# Patient Record
Sex: Male | Born: 1960
Health system: Southern US, Community
[De-identification: ages and names within clinical notes are randomized; demographics above are authoritative.]

## PROBLEM LIST (undated history)

## (undated) DIAGNOSIS — G4730 Sleep apnea, unspecified: Secondary | ICD-10-CM

## (undated) DIAGNOSIS — J449 Chronic obstructive pulmonary disease, unspecified: Secondary | ICD-10-CM

## (undated) DIAGNOSIS — F329 Major depressive disorder, single episode, unspecified: Secondary | ICD-10-CM

## (undated) DIAGNOSIS — F32A Depression, unspecified: Secondary | ICD-10-CM

## (undated) DIAGNOSIS — J189 Pneumonia, unspecified organism: Secondary | ICD-10-CM

## (undated) DIAGNOSIS — Z9981 Dependence on supplemental oxygen: Secondary | ICD-10-CM

## (undated) DIAGNOSIS — E119 Type 2 diabetes mellitus without complications: Secondary | ICD-10-CM

## (undated) DIAGNOSIS — I209 Angina pectoris, unspecified: Secondary | ICD-10-CM

## (undated) DIAGNOSIS — I10 Essential (primary) hypertension: Secondary | ICD-10-CM

## (undated) DIAGNOSIS — I219 Acute myocardial infarction, unspecified: Secondary | ICD-10-CM

## (undated) DIAGNOSIS — K219 Gastro-esophageal reflux disease without esophagitis: Secondary | ICD-10-CM

## (undated) DIAGNOSIS — M1990 Unspecified osteoarthritis, unspecified site: Secondary | ICD-10-CM

## (undated) DIAGNOSIS — I509 Heart failure, unspecified: Secondary | ICD-10-CM

## (undated) DIAGNOSIS — F419 Anxiety disorder, unspecified: Secondary | ICD-10-CM

## (undated) DIAGNOSIS — K5792 Diverticulitis of intestine, part unspecified, without perforation or abscess without bleeding: Secondary | ICD-10-CM

## (undated) DIAGNOSIS — S46002A Unspecified injury of muscle(s) and tendon(s) of the rotator cuff of left shoulder, initial encounter: Secondary | ICD-10-CM

## (undated) HISTORY — PX: ROTATOR CUFF REPAIR: SHX139

## (undated) HISTORY — DX: Diverticulitis of intestine, part unspecified, without perforation or abscess without bleeding: K57.92

## (undated) HISTORY — PX: CORONARY ARTERY BYPASS GRAFT: SHX141

## (undated) HISTORY — PX: CORONARY ANGIOPLASTY: SHX604

## (undated) HISTORY — DX: Chronic obstructive pulmonary disease, unspecified: J44.9

---

## 2005-08-23 ENCOUNTER — Inpatient Hospital Stay (HOSPITAL_COMMUNITY): Admission: EM | Admit: 2005-08-23 | Discharge: 2005-08-25 | Payer: Self-pay | Admitting: Cardiovascular Disease

## 2006-02-17 ENCOUNTER — Inpatient Hospital Stay (HOSPITAL_COMMUNITY): Admission: AD | Admit: 2006-02-17 | Discharge: 2006-02-27 | Payer: Self-pay | Admitting: Cardiovascular Disease

## 2011-03-19 DIAGNOSIS — Z6833 Body mass index (BMI) 33.0-33.9, adult: Secondary | ICD-10-CM | POA: Diagnosis not present

## 2011-03-19 DIAGNOSIS — S2220XA Unspecified fracture of sternum, initial encounter for closed fracture: Secondary | ICD-10-CM | POA: Diagnosis not present

## 2011-03-19 DIAGNOSIS — E291 Testicular hypofunction: Secondary | ICD-10-CM | POA: Diagnosis not present

## 2011-03-19 DIAGNOSIS — M5030 Other cervical disc degeneration, unspecified cervical region: Secondary | ICD-10-CM | POA: Diagnosis not present

## 2011-03-20 DIAGNOSIS — K4090 Unilateral inguinal hernia, without obstruction or gangrene, not specified as recurrent: Secondary | ICD-10-CM | POA: Diagnosis not present

## 2011-03-20 DIAGNOSIS — R109 Unspecified abdominal pain: Secondary | ICD-10-CM | POA: Diagnosis not present

## 2011-03-20 DIAGNOSIS — I10 Essential (primary) hypertension: Secondary | ICD-10-CM | POA: Diagnosis not present

## 2011-03-20 DIAGNOSIS — E119 Type 2 diabetes mellitus without complications: Secondary | ICD-10-CM | POA: Diagnosis not present

## 2011-04-02 DIAGNOSIS — E291 Testicular hypofunction: Secondary | ICD-10-CM | POA: Diagnosis not present

## 2011-04-05 DIAGNOSIS — R109 Unspecified abdominal pain: Secondary | ICD-10-CM | POA: Diagnosis not present

## 2011-04-05 DIAGNOSIS — I10 Essential (primary) hypertension: Secondary | ICD-10-CM | POA: Diagnosis not present

## 2011-04-05 DIAGNOSIS — N419 Inflammatory disease of prostate, unspecified: Secondary | ICD-10-CM | POA: Diagnosis not present

## 2011-04-05 DIAGNOSIS — Z951 Presence of aortocoronary bypass graft: Secondary | ICD-10-CM | POA: Diagnosis not present

## 2011-04-05 DIAGNOSIS — I2510 Atherosclerotic heart disease of native coronary artery without angina pectoris: Secondary | ICD-10-CM | POA: Diagnosis not present

## 2011-04-05 DIAGNOSIS — R5383 Other fatigue: Secondary | ICD-10-CM | POA: Diagnosis not present

## 2011-04-05 DIAGNOSIS — R5381 Other malaise: Secondary | ICD-10-CM | POA: Diagnosis not present

## 2011-04-05 DIAGNOSIS — E119 Type 2 diabetes mellitus without complications: Secondary | ICD-10-CM | POA: Diagnosis not present

## 2011-04-05 DIAGNOSIS — N410 Acute prostatitis: Secondary | ICD-10-CM | POA: Diagnosis not present

## 2011-04-16 DIAGNOSIS — Z79899 Other long term (current) drug therapy: Secondary | ICD-10-CM | POA: Diagnosis not present

## 2011-04-16 DIAGNOSIS — E291 Testicular hypofunction: Secondary | ICD-10-CM | POA: Diagnosis not present

## 2011-04-16 DIAGNOSIS — G894 Chronic pain syndrome: Secondary | ICD-10-CM | POA: Diagnosis not present

## 2011-04-16 DIAGNOSIS — Z6833 Body mass index (BMI) 33.0-33.9, adult: Secondary | ICD-10-CM | POA: Diagnosis not present

## 2011-04-16 DIAGNOSIS — I10 Essential (primary) hypertension: Secondary | ICD-10-CM | POA: Diagnosis not present

## 2011-04-30 DIAGNOSIS — E291 Testicular hypofunction: Secondary | ICD-10-CM | POA: Diagnosis not present

## 2011-05-14 DIAGNOSIS — G894 Chronic pain syndrome: Secondary | ICD-10-CM | POA: Diagnosis not present

## 2011-05-14 DIAGNOSIS — E291 Testicular hypofunction: Secondary | ICD-10-CM | POA: Diagnosis not present

## 2011-05-27 DIAGNOSIS — E291 Testicular hypofunction: Secondary | ICD-10-CM | POA: Diagnosis not present

## 2011-06-10 DIAGNOSIS — J0100 Acute maxillary sinusitis, unspecified: Secondary | ICD-10-CM | POA: Diagnosis not present

## 2011-06-10 DIAGNOSIS — Z6834 Body mass index (BMI) 34.0-34.9, adult: Secondary | ICD-10-CM | POA: Diagnosis not present

## 2011-06-10 DIAGNOSIS — S2220XA Unspecified fracture of sternum, initial encounter for closed fracture: Secondary | ICD-10-CM | POA: Diagnosis not present

## 2011-06-10 DIAGNOSIS — E291 Testicular hypofunction: Secondary | ICD-10-CM | POA: Diagnosis not present

## 2011-06-10 DIAGNOSIS — G894 Chronic pain syndrome: Secondary | ICD-10-CM | POA: Diagnosis not present

## 2011-06-24 DIAGNOSIS — E291 Testicular hypofunction: Secondary | ICD-10-CM | POA: Diagnosis not present

## 2011-07-09 DIAGNOSIS — E785 Hyperlipidemia, unspecified: Secondary | ICD-10-CM | POA: Diagnosis not present

## 2011-07-09 DIAGNOSIS — G894 Chronic pain syndrome: Secondary | ICD-10-CM | POA: Diagnosis not present

## 2011-07-09 DIAGNOSIS — Z79899 Other long term (current) drug therapy: Secondary | ICD-10-CM | POA: Diagnosis not present

## 2011-07-09 DIAGNOSIS — E291 Testicular hypofunction: Secondary | ICD-10-CM | POA: Diagnosis not present

## 2011-07-09 DIAGNOSIS — Z6834 Body mass index (BMI) 34.0-34.9, adult: Secondary | ICD-10-CM | POA: Diagnosis not present

## 2011-07-09 DIAGNOSIS — E119 Type 2 diabetes mellitus without complications: Secondary | ICD-10-CM | POA: Diagnosis not present

## 2011-07-23 DIAGNOSIS — E291 Testicular hypofunction: Secondary | ICD-10-CM | POA: Diagnosis not present

## 2011-08-06 DIAGNOSIS — G894 Chronic pain syndrome: Secondary | ICD-10-CM | POA: Diagnosis not present

## 2011-08-06 DIAGNOSIS — Z6834 Body mass index (BMI) 34.0-34.9, adult: Secondary | ICD-10-CM | POA: Diagnosis not present

## 2011-08-06 DIAGNOSIS — S2220XA Unspecified fracture of sternum, initial encounter for closed fracture: Secondary | ICD-10-CM | POA: Diagnosis not present

## 2011-08-06 DIAGNOSIS — E291 Testicular hypofunction: Secondary | ICD-10-CM | POA: Diagnosis not present

## 2011-08-06 DIAGNOSIS — M5030 Other cervical disc degeneration, unspecified cervical region: Secondary | ICD-10-CM | POA: Diagnosis not present

## 2011-08-13 DIAGNOSIS — E291 Testicular hypofunction: Secondary | ICD-10-CM | POA: Diagnosis not present

## 2011-08-20 DIAGNOSIS — E291 Testicular hypofunction: Secondary | ICD-10-CM | POA: Diagnosis not present

## 2011-08-27 DIAGNOSIS — E291 Testicular hypofunction: Secondary | ICD-10-CM | POA: Diagnosis not present

## 2011-09-03 DIAGNOSIS — E291 Testicular hypofunction: Secondary | ICD-10-CM | POA: Diagnosis not present

## 2011-09-03 DIAGNOSIS — M5030 Other cervical disc degeneration, unspecified cervical region: Secondary | ICD-10-CM | POA: Diagnosis not present

## 2011-09-03 DIAGNOSIS — Z6834 Body mass index (BMI) 34.0-34.9, adult: Secondary | ICD-10-CM | POA: Diagnosis not present

## 2011-09-03 DIAGNOSIS — G894 Chronic pain syndrome: Secondary | ICD-10-CM | POA: Diagnosis not present

## 2011-09-03 DIAGNOSIS — S2220XA Unspecified fracture of sternum, initial encounter for closed fracture: Secondary | ICD-10-CM | POA: Diagnosis not present

## 2011-09-10 DIAGNOSIS — E291 Testicular hypofunction: Secondary | ICD-10-CM | POA: Diagnosis not present

## 2011-09-13 DIAGNOSIS — R5381 Other malaise: Secondary | ICD-10-CM | POA: Diagnosis not present

## 2011-09-13 DIAGNOSIS — I10 Essential (primary) hypertension: Secondary | ICD-10-CM | POA: Diagnosis not present

## 2011-09-13 DIAGNOSIS — R6889 Other general symptoms and signs: Secondary | ICD-10-CM | POA: Diagnosis not present

## 2011-09-13 DIAGNOSIS — E876 Hypokalemia: Secondary | ICD-10-CM | POA: Diagnosis not present

## 2011-09-13 DIAGNOSIS — I252 Old myocardial infarction: Secondary | ICD-10-CM | POA: Diagnosis not present

## 2011-09-13 DIAGNOSIS — R5383 Other fatigue: Secondary | ICD-10-CM | POA: Diagnosis not present

## 2011-09-13 DIAGNOSIS — R0789 Other chest pain: Secondary | ICD-10-CM | POA: Diagnosis not present

## 2011-09-13 DIAGNOSIS — E7800 Pure hypercholesterolemia, unspecified: Secondary | ICD-10-CM | POA: Diagnosis not present

## 2011-09-13 DIAGNOSIS — R252 Cramp and spasm: Secondary | ICD-10-CM | POA: Diagnosis not present

## 2011-09-13 DIAGNOSIS — E119 Type 2 diabetes mellitus without complications: Secondary | ICD-10-CM | POA: Diagnosis not present

## 2011-09-13 DIAGNOSIS — R072 Precordial pain: Secondary | ICD-10-CM | POA: Diagnosis not present

## 2011-09-13 DIAGNOSIS — IMO0001 Reserved for inherently not codable concepts without codable children: Secondary | ICD-10-CM | POA: Diagnosis not present

## 2011-09-13 DIAGNOSIS — I509 Heart failure, unspecified: Secondary | ICD-10-CM | POA: Diagnosis not present

## 2011-09-17 DIAGNOSIS — E291 Testicular hypofunction: Secondary | ICD-10-CM | POA: Diagnosis not present

## 2011-09-22 DIAGNOSIS — E876 Hypokalemia: Secondary | ICD-10-CM | POA: Diagnosis not present

## 2011-09-24 DIAGNOSIS — E291 Testicular hypofunction: Secondary | ICD-10-CM | POA: Diagnosis not present

## 2011-09-26 DIAGNOSIS — Z6834 Body mass index (BMI) 34.0-34.9, adult: Secondary | ICD-10-CM | POA: Diagnosis not present

## 2011-09-26 DIAGNOSIS — K589 Irritable bowel syndrome without diarrhea: Secondary | ICD-10-CM | POA: Diagnosis not present

## 2011-09-26 DIAGNOSIS — K2900 Acute gastritis without bleeding: Secondary | ICD-10-CM | POA: Diagnosis not present

## 2011-10-03 DIAGNOSIS — E291 Testicular hypofunction: Secondary | ICD-10-CM | POA: Diagnosis not present

## 2011-10-10 DIAGNOSIS — E291 Testicular hypofunction: Secondary | ICD-10-CM | POA: Diagnosis not present

## 2011-10-17 DIAGNOSIS — E291 Testicular hypofunction: Secondary | ICD-10-CM | POA: Diagnosis not present

## 2011-10-24 DIAGNOSIS — S2220XA Unspecified fracture of sternum, initial encounter for closed fracture: Secondary | ICD-10-CM | POA: Diagnosis not present

## 2011-10-24 DIAGNOSIS — Z6835 Body mass index (BMI) 35.0-35.9, adult: Secondary | ICD-10-CM | POA: Diagnosis not present

## 2011-10-24 DIAGNOSIS — E291 Testicular hypofunction: Secondary | ICD-10-CM | POA: Diagnosis not present

## 2011-10-24 DIAGNOSIS — G894 Chronic pain syndrome: Secondary | ICD-10-CM | POA: Diagnosis not present

## 2011-10-24 DIAGNOSIS — M5030 Other cervical disc degeneration, unspecified cervical region: Secondary | ICD-10-CM | POA: Diagnosis not present

## 2011-10-24 DIAGNOSIS — E876 Hypokalemia: Secondary | ICD-10-CM | POA: Diagnosis not present

## 2011-10-28 DIAGNOSIS — E119 Type 2 diabetes mellitus without complications: Secondary | ICD-10-CM | POA: Diagnosis not present

## 2011-10-28 DIAGNOSIS — I428 Other cardiomyopathies: Secondary | ICD-10-CM | POA: Diagnosis not present

## 2011-10-28 DIAGNOSIS — Z9119 Patient's noncompliance with other medical treatment and regimen: Secondary | ICD-10-CM | POA: Diagnosis not present

## 2011-10-28 DIAGNOSIS — I2510 Atherosclerotic heart disease of native coronary artery without angina pectoris: Secondary | ICD-10-CM | POA: Diagnosis not present

## 2011-10-28 DIAGNOSIS — R079 Chest pain, unspecified: Secondary | ICD-10-CM | POA: Diagnosis not present

## 2011-10-28 DIAGNOSIS — R1012 Left upper quadrant pain: Secondary | ICD-10-CM | POA: Diagnosis not present

## 2011-10-28 DIAGNOSIS — G8929 Other chronic pain: Secondary | ICD-10-CM | POA: Diagnosis not present

## 2011-10-28 DIAGNOSIS — R0602 Shortness of breath: Secondary | ICD-10-CM | POA: Diagnosis not present

## 2011-10-28 DIAGNOSIS — R0789 Other chest pain: Secondary | ICD-10-CM | POA: Diagnosis not present

## 2011-10-28 DIAGNOSIS — R072 Precordial pain: Secondary | ICD-10-CM | POA: Diagnosis not present

## 2011-10-28 DIAGNOSIS — Z951 Presence of aortocoronary bypass graft: Secondary | ICD-10-CM | POA: Diagnosis not present

## 2011-10-31 DIAGNOSIS — E291 Testicular hypofunction: Secondary | ICD-10-CM | POA: Diagnosis not present

## 2011-11-04 DIAGNOSIS — R1013 Epigastric pain: Secondary | ICD-10-CM | POA: Diagnosis not present

## 2011-11-04 DIAGNOSIS — R1012 Left upper quadrant pain: Secondary | ICD-10-CM | POA: Diagnosis not present

## 2011-11-10 DIAGNOSIS — E785 Hyperlipidemia, unspecified: Secondary | ICD-10-CM | POA: Diagnosis not present

## 2011-11-10 DIAGNOSIS — E119 Type 2 diabetes mellitus without complications: Secondary | ICD-10-CM | POA: Diagnosis not present

## 2011-11-10 DIAGNOSIS — I10 Essential (primary) hypertension: Secondary | ICD-10-CM | POA: Diagnosis not present

## 2011-11-10 DIAGNOSIS — D485 Neoplasm of uncertain behavior of skin: Secondary | ICD-10-CM | POA: Diagnosis not present

## 2011-11-10 DIAGNOSIS — L708 Other acne: Secondary | ICD-10-CM | POA: Diagnosis not present

## 2011-11-10 DIAGNOSIS — I2510 Atherosclerotic heart disease of native coronary artery without angina pectoris: Secondary | ICD-10-CM | POA: Diagnosis not present

## 2011-11-14 DIAGNOSIS — E291 Testicular hypofunction: Secondary | ICD-10-CM | POA: Diagnosis not present

## 2011-11-21 DIAGNOSIS — I10 Essential (primary) hypertension: Secondary | ICD-10-CM | POA: Diagnosis not present

## 2011-11-21 DIAGNOSIS — G894 Chronic pain syndrome: Secondary | ICD-10-CM | POA: Diagnosis not present

## 2011-11-21 DIAGNOSIS — Z6835 Body mass index (BMI) 35.0-35.9, adult: Secondary | ICD-10-CM | POA: Diagnosis not present

## 2011-11-21 DIAGNOSIS — E291 Testicular hypofunction: Secondary | ICD-10-CM | POA: Diagnosis not present

## 2011-11-28 DIAGNOSIS — E291 Testicular hypofunction: Secondary | ICD-10-CM | POA: Diagnosis not present

## 2011-12-05 DIAGNOSIS — E291 Testicular hypofunction: Secondary | ICD-10-CM | POA: Diagnosis not present

## 2011-12-19 DIAGNOSIS — S2220XA Unspecified fracture of sternum, initial encounter for closed fracture: Secondary | ICD-10-CM | POA: Diagnosis not present

## 2011-12-19 DIAGNOSIS — M5137 Other intervertebral disc degeneration, lumbosacral region: Secondary | ICD-10-CM | POA: Diagnosis not present

## 2011-12-19 DIAGNOSIS — G8911 Acute pain due to trauma: Secondary | ICD-10-CM | POA: Diagnosis not present

## 2011-12-19 DIAGNOSIS — G894 Chronic pain syndrome: Secondary | ICD-10-CM | POA: Diagnosis not present

## 2011-12-19 DIAGNOSIS — M5030 Other cervical disc degeneration, unspecified cervical region: Secondary | ICD-10-CM | POA: Diagnosis not present

## 2011-12-19 DIAGNOSIS — E291 Testicular hypofunction: Secondary | ICD-10-CM | POA: Diagnosis not present

## 2011-12-26 DIAGNOSIS — E291 Testicular hypofunction: Secondary | ICD-10-CM | POA: Diagnosis not present

## 2012-01-09 DIAGNOSIS — E291 Testicular hypofunction: Secondary | ICD-10-CM | POA: Diagnosis not present

## 2012-01-09 DIAGNOSIS — Z79899 Other long term (current) drug therapy: Secondary | ICD-10-CM | POA: Diagnosis not present

## 2012-01-16 DIAGNOSIS — S2220XA Unspecified fracture of sternum, initial encounter for closed fracture: Secondary | ICD-10-CM | POA: Diagnosis not present

## 2012-01-16 DIAGNOSIS — Z6834 Body mass index (BMI) 34.0-34.9, adult: Secondary | ICD-10-CM | POA: Diagnosis not present

## 2012-01-16 DIAGNOSIS — E291 Testicular hypofunction: Secondary | ICD-10-CM | POA: Diagnosis not present

## 2012-01-16 DIAGNOSIS — G894 Chronic pain syndrome: Secondary | ICD-10-CM | POA: Diagnosis not present

## 2012-01-16 DIAGNOSIS — M5030 Other cervical disc degeneration, unspecified cervical region: Secondary | ICD-10-CM | POA: Diagnosis not present

## 2012-01-23 DIAGNOSIS — E291 Testicular hypofunction: Secondary | ICD-10-CM | POA: Diagnosis not present

## 2012-01-30 DIAGNOSIS — E291 Testicular hypofunction: Secondary | ICD-10-CM | POA: Diagnosis not present

## 2012-02-13 DIAGNOSIS — S2220XA Unspecified fracture of sternum, initial encounter for closed fracture: Secondary | ICD-10-CM | POA: Diagnosis not present

## 2012-02-13 DIAGNOSIS — Z6834 Body mass index (BMI) 34.0-34.9, adult: Secondary | ICD-10-CM | POA: Diagnosis not present

## 2012-02-13 DIAGNOSIS — G894 Chronic pain syndrome: Secondary | ICD-10-CM | POA: Diagnosis not present

## 2012-02-13 DIAGNOSIS — M5030 Other cervical disc degeneration, unspecified cervical region: Secondary | ICD-10-CM | POA: Diagnosis not present

## 2012-02-13 DIAGNOSIS — E291 Testicular hypofunction: Secondary | ICD-10-CM | POA: Diagnosis not present

## 2012-02-20 DIAGNOSIS — E291 Testicular hypofunction: Secondary | ICD-10-CM | POA: Diagnosis not present

## 2012-02-27 DIAGNOSIS — E291 Testicular hypofunction: Secondary | ICD-10-CM | POA: Diagnosis not present

## 2012-03-05 DIAGNOSIS — E291 Testicular hypofunction: Secondary | ICD-10-CM | POA: Diagnosis not present

## 2012-03-07 DIAGNOSIS — I2510 Atherosclerotic heart disease of native coronary artery without angina pectoris: Secondary | ICD-10-CM | POA: Diagnosis not present

## 2012-03-07 DIAGNOSIS — R0789 Other chest pain: Secondary | ICD-10-CM | POA: Diagnosis not present

## 2012-03-07 DIAGNOSIS — I509 Heart failure, unspecified: Secondary | ICD-10-CM | POA: Diagnosis not present

## 2012-03-07 DIAGNOSIS — R079 Chest pain, unspecified: Secondary | ICD-10-CM | POA: Diagnosis not present

## 2012-03-07 DIAGNOSIS — R0609 Other forms of dyspnea: Secondary | ICD-10-CM | POA: Diagnosis not present

## 2012-03-07 DIAGNOSIS — E119 Type 2 diabetes mellitus without complications: Secondary | ICD-10-CM | POA: Diagnosis not present

## 2012-03-07 DIAGNOSIS — I252 Old myocardial infarction: Secondary | ICD-10-CM | POA: Diagnosis not present

## 2012-03-07 DIAGNOSIS — R0602 Shortness of breath: Secondary | ICD-10-CM | POA: Diagnosis not present

## 2012-03-07 DIAGNOSIS — K219 Gastro-esophageal reflux disease without esophagitis: Secondary | ICD-10-CM | POA: Diagnosis not present

## 2012-03-12 DIAGNOSIS — E291 Testicular hypofunction: Secondary | ICD-10-CM | POA: Diagnosis not present

## 2012-03-12 DIAGNOSIS — M5030 Other cervical disc degeneration, unspecified cervical region: Secondary | ICD-10-CM | POA: Diagnosis not present

## 2012-03-12 DIAGNOSIS — Z6834 Body mass index (BMI) 34.0-34.9, adult: Secondary | ICD-10-CM | POA: Diagnosis not present

## 2012-03-12 DIAGNOSIS — I10 Essential (primary) hypertension: Secondary | ICD-10-CM | POA: Diagnosis not present

## 2012-03-12 DIAGNOSIS — G894 Chronic pain syndrome: Secondary | ICD-10-CM | POA: Diagnosis not present

## 2012-03-12 DIAGNOSIS — S2220XA Unspecified fracture of sternum, initial encounter for closed fracture: Secondary | ICD-10-CM | POA: Diagnosis not present

## 2012-03-12 DIAGNOSIS — IMO0001 Reserved for inherently not codable concepts without codable children: Secondary | ICD-10-CM | POA: Diagnosis not present

## 2012-03-12 DIAGNOSIS — E785 Hyperlipidemia, unspecified: Secondary | ICD-10-CM | POA: Diagnosis not present

## 2012-03-19 DIAGNOSIS — E291 Testicular hypofunction: Secondary | ICD-10-CM | POA: Diagnosis not present

## 2012-03-26 DIAGNOSIS — E291 Testicular hypofunction: Secondary | ICD-10-CM | POA: Diagnosis not present

## 2012-04-02 DIAGNOSIS — E291 Testicular hypofunction: Secondary | ICD-10-CM | POA: Diagnosis not present

## 2012-04-09 DIAGNOSIS — M5030 Other cervical disc degeneration, unspecified cervical region: Secondary | ICD-10-CM | POA: Diagnosis not present

## 2012-04-09 DIAGNOSIS — E291 Testicular hypofunction: Secondary | ICD-10-CM | POA: Diagnosis not present

## 2012-04-09 DIAGNOSIS — Z6835 Body mass index (BMI) 35.0-35.9, adult: Secondary | ICD-10-CM | POA: Diagnosis not present

## 2012-04-09 DIAGNOSIS — S2220XA Unspecified fracture of sternum, initial encounter for closed fracture: Secondary | ICD-10-CM | POA: Diagnosis not present

## 2012-04-09 DIAGNOSIS — G894 Chronic pain syndrome: Secondary | ICD-10-CM | POA: Diagnosis not present

## 2012-04-16 DIAGNOSIS — E291 Testicular hypofunction: Secondary | ICD-10-CM | POA: Diagnosis not present

## 2012-04-23 DIAGNOSIS — E291 Testicular hypofunction: Secondary | ICD-10-CM | POA: Diagnosis not present

## 2012-04-25 DIAGNOSIS — R072 Precordial pain: Secondary | ICD-10-CM | POA: Diagnosis not present

## 2012-04-25 DIAGNOSIS — R0989 Other specified symptoms and signs involving the circulatory and respiratory systems: Secondary | ICD-10-CM | POA: Diagnosis not present

## 2012-04-25 DIAGNOSIS — E119 Type 2 diabetes mellitus without complications: Secondary | ICD-10-CM | POA: Diagnosis not present

## 2012-04-25 DIAGNOSIS — R0609 Other forms of dyspnea: Secondary | ICD-10-CM | POA: Diagnosis not present

## 2012-04-25 DIAGNOSIS — R0602 Shortness of breath: Secondary | ICD-10-CM | POA: Diagnosis not present

## 2012-04-25 DIAGNOSIS — R509 Fever, unspecified: Secondary | ICD-10-CM | POA: Diagnosis not present

## 2012-04-25 DIAGNOSIS — E7800 Pure hypercholesterolemia, unspecified: Secondary | ICD-10-CM | POA: Diagnosis not present

## 2012-04-25 DIAGNOSIS — J9600 Acute respiratory failure, unspecified whether with hypoxia or hypercapnia: Secondary | ICD-10-CM | POA: Diagnosis not present

## 2012-04-25 DIAGNOSIS — I509 Heart failure, unspecified: Secondary | ICD-10-CM | POA: Diagnosis not present

## 2012-04-25 DIAGNOSIS — R079 Chest pain, unspecified: Secondary | ICD-10-CM | POA: Diagnosis not present

## 2012-04-25 DIAGNOSIS — R0902 Hypoxemia: Secondary | ICD-10-CM | POA: Diagnosis not present

## 2012-04-25 DIAGNOSIS — J069 Acute upper respiratory infection, unspecified: Secondary | ICD-10-CM | POA: Diagnosis not present

## 2012-04-25 DIAGNOSIS — I2510 Atherosclerotic heart disease of native coronary artery without angina pectoris: Secondary | ICD-10-CM | POA: Diagnosis not present

## 2012-04-25 DIAGNOSIS — J180 Bronchopneumonia, unspecified organism: Secondary | ICD-10-CM | POA: Diagnosis not present

## 2012-04-25 DIAGNOSIS — I10 Essential (primary) hypertension: Secondary | ICD-10-CM | POA: Diagnosis not present

## 2012-04-26 DIAGNOSIS — K219 Gastro-esophageal reflux disease without esophagitis: Secondary | ICD-10-CM | POA: Diagnosis not present

## 2012-04-26 DIAGNOSIS — J441 Chronic obstructive pulmonary disease with (acute) exacerbation: Secondary | ICD-10-CM | POA: Diagnosis not present

## 2012-04-26 DIAGNOSIS — J9600 Acute respiratory failure, unspecified whether with hypoxia or hypercapnia: Secondary | ICD-10-CM | POA: Diagnosis not present

## 2012-04-26 DIAGNOSIS — I10 Essential (primary) hypertension: Secondary | ICD-10-CM | POA: Diagnosis not present

## 2012-04-26 DIAGNOSIS — Z23 Encounter for immunization: Secondary | ICD-10-CM | POA: Diagnosis not present

## 2012-04-26 DIAGNOSIS — Z9119 Patient's noncompliance with other medical treatment and regimen: Secondary | ICD-10-CM | POA: Diagnosis not present

## 2012-04-26 DIAGNOSIS — Z794 Long term (current) use of insulin: Secondary | ICD-10-CM | POA: Diagnosis not present

## 2012-04-26 DIAGNOSIS — I2510 Atherosclerotic heart disease of native coronary artery without angina pectoris: Secondary | ICD-10-CM | POA: Diagnosis not present

## 2012-04-26 DIAGNOSIS — I509 Heart failure, unspecified: Secondary | ICD-10-CM | POA: Diagnosis not present

## 2012-04-26 DIAGNOSIS — R509 Fever, unspecified: Secondary | ICD-10-CM | POA: Diagnosis not present

## 2012-04-26 DIAGNOSIS — R0609 Other forms of dyspnea: Secondary | ICD-10-CM | POA: Diagnosis not present

## 2012-04-26 DIAGNOSIS — Z79899 Other long term (current) drug therapy: Secondary | ICD-10-CM | POA: Diagnosis not present

## 2012-04-26 DIAGNOSIS — E119 Type 2 diabetes mellitus without complications: Secondary | ICD-10-CM | POA: Diagnosis not present

## 2012-04-26 DIAGNOSIS — E7800 Pure hypercholesterolemia, unspecified: Secondary | ICD-10-CM | POA: Diagnosis not present

## 2012-04-26 DIAGNOSIS — Z7982 Long term (current) use of aspirin: Secondary | ICD-10-CM | POA: Diagnosis not present

## 2012-04-26 DIAGNOSIS — J189 Pneumonia, unspecified organism: Secondary | ICD-10-CM | POA: Diagnosis not present

## 2012-04-26 DIAGNOSIS — R072 Precordial pain: Secondary | ICD-10-CM | POA: Diagnosis not present

## 2012-04-26 DIAGNOSIS — I5023 Acute on chronic systolic (congestive) heart failure: Secondary | ICD-10-CM | POA: Diagnosis not present

## 2012-04-26 DIAGNOSIS — J069 Acute upper respiratory infection, unspecified: Secondary | ICD-10-CM | POA: Diagnosis not present

## 2012-04-26 DIAGNOSIS — J180 Bronchopneumonia, unspecified organism: Secondary | ICD-10-CM | POA: Diagnosis not present

## 2012-04-26 DIAGNOSIS — Z87891 Personal history of nicotine dependence: Secondary | ICD-10-CM | POA: Diagnosis not present

## 2012-04-26 DIAGNOSIS — R0902 Hypoxemia: Secondary | ICD-10-CM | POA: Diagnosis not present

## 2012-05-07 DIAGNOSIS — I2510 Atherosclerotic heart disease of native coronary artery without angina pectoris: Secondary | ICD-10-CM | POA: Diagnosis not present

## 2012-05-07 DIAGNOSIS — E291 Testicular hypofunction: Secondary | ICD-10-CM | POA: Diagnosis not present

## 2012-05-07 DIAGNOSIS — Z6833 Body mass index (BMI) 33.0-33.9, adult: Secondary | ICD-10-CM | POA: Diagnosis not present

## 2012-05-07 DIAGNOSIS — J441 Chronic obstructive pulmonary disease with (acute) exacerbation: Secondary | ICD-10-CM | POA: Diagnosis not present

## 2012-05-07 DIAGNOSIS — J180 Bronchopneumonia, unspecified organism: Secondary | ICD-10-CM | POA: Diagnosis not present

## 2012-05-12 DIAGNOSIS — J180 Bronchopneumonia, unspecified organism: Secondary | ICD-10-CM | POA: Diagnosis not present

## 2012-05-14 DIAGNOSIS — E291 Testicular hypofunction: Secondary | ICD-10-CM | POA: Diagnosis not present

## 2012-05-17 DIAGNOSIS — J029 Acute pharyngitis, unspecified: Secondary | ICD-10-CM | POA: Diagnosis not present

## 2012-05-28 DIAGNOSIS — E291 Testicular hypofunction: Secondary | ICD-10-CM | POA: Diagnosis not present

## 2012-06-01 DIAGNOSIS — E785 Hyperlipidemia, unspecified: Secondary | ICD-10-CM | POA: Diagnosis not present

## 2012-06-01 DIAGNOSIS — I428 Other cardiomyopathies: Secondary | ICD-10-CM | POA: Diagnosis not present

## 2012-06-01 DIAGNOSIS — I10 Essential (primary) hypertension: Secondary | ICD-10-CM | POA: Diagnosis not present

## 2012-06-01 DIAGNOSIS — I2510 Atherosclerotic heart disease of native coronary artery without angina pectoris: Secondary | ICD-10-CM | POA: Diagnosis not present

## 2012-06-01 DIAGNOSIS — E119 Type 2 diabetes mellitus without complications: Secondary | ICD-10-CM | POA: Diagnosis not present

## 2012-06-04 DIAGNOSIS — E291 Testicular hypofunction: Secondary | ICD-10-CM | POA: Diagnosis not present

## 2012-06-04 DIAGNOSIS — G894 Chronic pain syndrome: Secondary | ICD-10-CM | POA: Diagnosis not present

## 2012-06-11 DIAGNOSIS — E291 Testicular hypofunction: Secondary | ICD-10-CM | POA: Diagnosis not present

## 2012-06-18 DIAGNOSIS — E291 Testicular hypofunction: Secondary | ICD-10-CM | POA: Diagnosis not present

## 2012-06-25 DIAGNOSIS — E291 Testicular hypofunction: Secondary | ICD-10-CM | POA: Diagnosis not present

## 2012-06-28 DIAGNOSIS — R252 Cramp and spasm: Secondary | ICD-10-CM | POA: Diagnosis not present

## 2012-07-01 DIAGNOSIS — E291 Testicular hypofunction: Secondary | ICD-10-CM | POA: Diagnosis not present

## 2012-07-09 DIAGNOSIS — E291 Testicular hypofunction: Secondary | ICD-10-CM | POA: Diagnosis not present

## 2012-07-15 DIAGNOSIS — E291 Testicular hypofunction: Secondary | ICD-10-CM | POA: Diagnosis not present

## 2012-07-22 DIAGNOSIS — E291 Testicular hypofunction: Secondary | ICD-10-CM | POA: Diagnosis not present

## 2012-07-29 DIAGNOSIS — E291 Testicular hypofunction: Secondary | ICD-10-CM | POA: Diagnosis not present

## 2012-07-29 DIAGNOSIS — Z6834 Body mass index (BMI) 34.0-34.9, adult: Secondary | ICD-10-CM | POA: Diagnosis not present

## 2012-07-29 DIAGNOSIS — I10 Essential (primary) hypertension: Secondary | ICD-10-CM | POA: Diagnosis not present

## 2012-07-29 DIAGNOSIS — E785 Hyperlipidemia, unspecified: Secondary | ICD-10-CM | POA: Diagnosis not present

## 2012-07-31 DIAGNOSIS — J029 Acute pharyngitis, unspecified: Secondary | ICD-10-CM | POA: Diagnosis not present

## 2012-08-02 DIAGNOSIS — E785 Hyperlipidemia, unspecified: Secondary | ICD-10-CM | POA: Diagnosis not present

## 2012-08-02 DIAGNOSIS — I10 Essential (primary) hypertension: Secondary | ICD-10-CM | POA: Diagnosis not present

## 2012-08-02 DIAGNOSIS — E291 Testicular hypofunction: Secondary | ICD-10-CM | POA: Diagnosis not present

## 2012-08-05 DIAGNOSIS — E291 Testicular hypofunction: Secondary | ICD-10-CM | POA: Diagnosis not present

## 2012-08-06 DIAGNOSIS — Z6834 Body mass index (BMI) 34.0-34.9, adult: Secondary | ICD-10-CM | POA: Diagnosis not present

## 2012-08-06 DIAGNOSIS — B3781 Candidal esophagitis: Secondary | ICD-10-CM | POA: Diagnosis not present

## 2012-08-06 DIAGNOSIS — B370 Candidal stomatitis: Secondary | ICD-10-CM | POA: Diagnosis not present

## 2012-08-12 DIAGNOSIS — J209 Acute bronchitis, unspecified: Secondary | ICD-10-CM | POA: Diagnosis not present

## 2012-08-12 DIAGNOSIS — Z6834 Body mass index (BMI) 34.0-34.9, adult: Secondary | ICD-10-CM | POA: Diagnosis not present

## 2012-08-12 DIAGNOSIS — E876 Hypokalemia: Secondary | ICD-10-CM | POA: Diagnosis not present

## 2012-08-13 DIAGNOSIS — E291 Testicular hypofunction: Secondary | ICD-10-CM | POA: Diagnosis not present

## 2012-08-19 DIAGNOSIS — E291 Testicular hypofunction: Secondary | ICD-10-CM | POA: Diagnosis not present

## 2012-08-26 DIAGNOSIS — E291 Testicular hypofunction: Secondary | ICD-10-CM | POA: Diagnosis not present

## 2012-08-26 DIAGNOSIS — M5030 Other cervical disc degeneration, unspecified cervical region: Secondary | ICD-10-CM | POA: Diagnosis not present

## 2012-08-26 DIAGNOSIS — R61 Generalized hyperhidrosis: Secondary | ICD-10-CM | POA: Diagnosis not present

## 2012-08-26 DIAGNOSIS — S2220XA Unspecified fracture of sternum, initial encounter for closed fracture: Secondary | ICD-10-CM | POA: Diagnosis not present

## 2012-08-26 DIAGNOSIS — G894 Chronic pain syndrome: Secondary | ICD-10-CM | POA: Diagnosis not present

## 2012-08-26 DIAGNOSIS — Z6833 Body mass index (BMI) 33.0-33.9, adult: Secondary | ICD-10-CM | POA: Diagnosis not present

## 2012-09-02 DIAGNOSIS — E291 Testicular hypofunction: Secondary | ICD-10-CM | POA: Diagnosis not present

## 2012-09-09 DIAGNOSIS — E291 Testicular hypofunction: Secondary | ICD-10-CM | POA: Diagnosis not present

## 2012-09-16 DIAGNOSIS — E291 Testicular hypofunction: Secondary | ICD-10-CM | POA: Diagnosis not present

## 2012-09-22 DIAGNOSIS — G894 Chronic pain syndrome: Secondary | ICD-10-CM | POA: Diagnosis not present

## 2012-09-22 DIAGNOSIS — E291 Testicular hypofunction: Secondary | ICD-10-CM | POA: Diagnosis not present

## 2012-09-22 DIAGNOSIS — Z6833 Body mass index (BMI) 33.0-33.9, adult: Secondary | ICD-10-CM | POA: Diagnosis not present

## 2012-09-22 DIAGNOSIS — K148 Other diseases of tongue: Secondary | ICD-10-CM | POA: Diagnosis not present

## 2012-09-29 DIAGNOSIS — E291 Testicular hypofunction: Secondary | ICD-10-CM | POA: Diagnosis not present

## 2012-09-30 DIAGNOSIS — J3489 Other specified disorders of nose and nasal sinuses: Secondary | ICD-10-CM | POA: Diagnosis not present

## 2012-09-30 DIAGNOSIS — K140 Glossitis: Secondary | ICD-10-CM | POA: Diagnosis not present

## 2012-09-30 DIAGNOSIS — J342 Deviated nasal septum: Secondary | ICD-10-CM | POA: Diagnosis not present

## 2012-09-30 DIAGNOSIS — J343 Hypertrophy of nasal turbinates: Secondary | ICD-10-CM | POA: Diagnosis not present

## 2012-10-06 DIAGNOSIS — E291 Testicular hypofunction: Secondary | ICD-10-CM | POA: Diagnosis not present

## 2012-10-13 DIAGNOSIS — E291 Testicular hypofunction: Secondary | ICD-10-CM | POA: Diagnosis not present

## 2012-10-20 DIAGNOSIS — I10 Essential (primary) hypertension: Secondary | ICD-10-CM | POA: Diagnosis not present

## 2012-10-20 DIAGNOSIS — E785 Hyperlipidemia, unspecified: Secondary | ICD-10-CM | POA: Diagnosis not present

## 2012-10-20 DIAGNOSIS — E291 Testicular hypofunction: Secondary | ICD-10-CM | POA: Diagnosis not present

## 2012-10-20 DIAGNOSIS — G894 Chronic pain syndrome: Secondary | ICD-10-CM | POA: Diagnosis not present

## 2012-10-27 DIAGNOSIS — E291 Testicular hypofunction: Secondary | ICD-10-CM | POA: Diagnosis not present

## 2012-11-03 DIAGNOSIS — E291 Testicular hypofunction: Secondary | ICD-10-CM | POA: Diagnosis not present

## 2012-11-10 DIAGNOSIS — E291 Testicular hypofunction: Secondary | ICD-10-CM | POA: Diagnosis not present

## 2012-11-17 DIAGNOSIS — IMO0001 Reserved for inherently not codable concepts without codable children: Secondary | ICD-10-CM | POA: Diagnosis not present

## 2012-11-17 DIAGNOSIS — I10 Essential (primary) hypertension: Secondary | ICD-10-CM | POA: Diagnosis not present

## 2012-11-17 DIAGNOSIS — G894 Chronic pain syndrome: Secondary | ICD-10-CM | POA: Diagnosis not present

## 2012-11-17 DIAGNOSIS — E291 Testicular hypofunction: Secondary | ICD-10-CM | POA: Diagnosis not present

## 2012-11-17 DIAGNOSIS — E785 Hyperlipidemia, unspecified: Secondary | ICD-10-CM | POA: Diagnosis not present

## 2012-11-24 DIAGNOSIS — E291 Testicular hypofunction: Secondary | ICD-10-CM | POA: Diagnosis not present

## 2012-12-01 DIAGNOSIS — E291 Testicular hypofunction: Secondary | ICD-10-CM | POA: Diagnosis not present

## 2012-12-02 DIAGNOSIS — J42 Unspecified chronic bronchitis: Secondary | ICD-10-CM | POA: Diagnosis not present

## 2012-12-02 DIAGNOSIS — J449 Chronic obstructive pulmonary disease, unspecified: Secondary | ICD-10-CM | POA: Diagnosis not present

## 2012-12-08 DIAGNOSIS — E291 Testicular hypofunction: Secondary | ICD-10-CM | POA: Diagnosis not present

## 2012-12-15 DIAGNOSIS — G894 Chronic pain syndrome: Secondary | ICD-10-CM | POA: Diagnosis not present

## 2012-12-15 DIAGNOSIS — E291 Testicular hypofunction: Secondary | ICD-10-CM | POA: Diagnosis not present

## 2012-12-15 DIAGNOSIS — S2220XA Unspecified fracture of sternum, initial encounter for closed fracture: Secondary | ICD-10-CM | POA: Diagnosis not present

## 2012-12-15 DIAGNOSIS — Z6834 Body mass index (BMI) 34.0-34.9, adult: Secondary | ICD-10-CM | POA: Diagnosis not present

## 2012-12-15 DIAGNOSIS — M5030 Other cervical disc degeneration, unspecified cervical region: Secondary | ICD-10-CM | POA: Diagnosis not present

## 2012-12-22 DIAGNOSIS — F410 Panic disorder [episodic paroxysmal anxiety] without agoraphobia: Secondary | ICD-10-CM | POA: Diagnosis not present

## 2012-12-22 DIAGNOSIS — E291 Testicular hypofunction: Secondary | ICD-10-CM | POA: Diagnosis not present

## 2012-12-22 DIAGNOSIS — F17200 Nicotine dependence, unspecified, uncomplicated: Secondary | ICD-10-CM | POA: Diagnosis present

## 2012-12-22 DIAGNOSIS — E1159 Type 2 diabetes mellitus with other circulatory complications: Secondary | ICD-10-CM | POA: Diagnosis not present

## 2012-12-22 DIAGNOSIS — R110 Nausea: Secondary | ICD-10-CM | POA: Diagnosis not present

## 2012-12-22 DIAGNOSIS — Z794 Long term (current) use of insulin: Secondary | ICD-10-CM | POA: Diagnosis not present

## 2012-12-22 DIAGNOSIS — Z23 Encounter for immunization: Secondary | ICD-10-CM | POA: Diagnosis not present

## 2012-12-22 DIAGNOSIS — R0789 Other chest pain: Secondary | ICD-10-CM | POA: Diagnosis not present

## 2012-12-22 DIAGNOSIS — R0602 Shortness of breath: Secondary | ICD-10-CM | POA: Diagnosis not present

## 2012-12-22 DIAGNOSIS — I252 Old myocardial infarction: Secondary | ICD-10-CM | POA: Diagnosis not present

## 2012-12-22 DIAGNOSIS — I509 Heart failure, unspecified: Secondary | ICD-10-CM | POA: Diagnosis not present

## 2012-12-22 DIAGNOSIS — I501 Left ventricular failure: Secondary | ICD-10-CM | POA: Diagnosis not present

## 2012-12-22 DIAGNOSIS — I200 Unstable angina: Secondary | ICD-10-CM | POA: Diagnosis not present

## 2012-12-22 DIAGNOSIS — R9431 Abnormal electrocardiogram [ECG] [EKG]: Secondary | ICD-10-CM | POA: Diagnosis not present

## 2012-12-22 DIAGNOSIS — I798 Other disorders of arteries, arterioles and capillaries in diseases classified elsewhere: Secondary | ICD-10-CM | POA: Diagnosis present

## 2012-12-22 DIAGNOSIS — Z9861 Coronary angioplasty status: Secondary | ICD-10-CM | POA: Diagnosis not present

## 2012-12-22 DIAGNOSIS — Z7982 Long term (current) use of aspirin: Secondary | ICD-10-CM | POA: Diagnosis not present

## 2012-12-22 DIAGNOSIS — E875 Hyperkalemia: Secondary | ICD-10-CM | POA: Diagnosis present

## 2012-12-22 DIAGNOSIS — E1169 Type 2 diabetes mellitus with other specified complication: Secondary | ICD-10-CM | POA: Diagnosis not present

## 2012-12-22 DIAGNOSIS — Z79899 Other long term (current) drug therapy: Secondary | ICD-10-CM | POA: Diagnosis not present

## 2012-12-22 DIAGNOSIS — I2510 Atherosclerotic heart disease of native coronary artery without angina pectoris: Secondary | ICD-10-CM | POA: Diagnosis present

## 2012-12-22 DIAGNOSIS — Z951 Presence of aortocoronary bypass graft: Secondary | ICD-10-CM | POA: Diagnosis not present

## 2012-12-22 DIAGNOSIS — R072 Precordial pain: Secondary | ICD-10-CM | POA: Diagnosis not present

## 2012-12-22 DIAGNOSIS — K219 Gastro-esophageal reflux disease without esophagitis: Secondary | ICD-10-CM | POA: Diagnosis not present

## 2012-12-22 DIAGNOSIS — E785 Hyperlipidemia, unspecified: Secondary | ICD-10-CM | POA: Diagnosis present

## 2012-12-22 DIAGNOSIS — I110 Hypertensive heart disease with heart failure: Secondary | ICD-10-CM | POA: Diagnosis present

## 2012-12-22 DIAGNOSIS — R079 Chest pain, unspecified: Secondary | ICD-10-CM | POA: Diagnosis not present

## 2012-12-29 DIAGNOSIS — E291 Testicular hypofunction: Secondary | ICD-10-CM | POA: Diagnosis not present

## 2013-01-02 DIAGNOSIS — G4733 Obstructive sleep apnea (adult) (pediatric): Secondary | ICD-10-CM | POA: Diagnosis not present

## 2013-01-05 DIAGNOSIS — R188 Other ascites: Secondary | ICD-10-CM | POA: Diagnosis not present

## 2013-01-05 DIAGNOSIS — F411 Generalized anxiety disorder: Secondary | ICD-10-CM | POA: Diagnosis not present

## 2013-01-05 DIAGNOSIS — E291 Testicular hypofunction: Secondary | ICD-10-CM | POA: Diagnosis not present

## 2013-01-05 DIAGNOSIS — Z6834 Body mass index (BMI) 34.0-34.9, adult: Secondary | ICD-10-CM | POA: Diagnosis not present

## 2013-01-12 DIAGNOSIS — S2220XA Unspecified fracture of sternum, initial encounter for closed fracture: Secondary | ICD-10-CM | POA: Diagnosis not present

## 2013-01-12 DIAGNOSIS — Z125 Encounter for screening for malignant neoplasm of prostate: Secondary | ICD-10-CM | POA: Diagnosis not present

## 2013-01-12 DIAGNOSIS — Z6834 Body mass index (BMI) 34.0-34.9, adult: Secondary | ICD-10-CM | POA: Diagnosis not present

## 2013-01-12 DIAGNOSIS — M5030 Other cervical disc degeneration, unspecified cervical region: Secondary | ICD-10-CM | POA: Diagnosis not present

## 2013-01-12 DIAGNOSIS — G894 Chronic pain syndrome: Secondary | ICD-10-CM | POA: Diagnosis not present

## 2013-01-12 DIAGNOSIS — E291 Testicular hypofunction: Secondary | ICD-10-CM | POA: Diagnosis not present

## 2013-01-19 DIAGNOSIS — E291 Testicular hypofunction: Secondary | ICD-10-CM | POA: Diagnosis not present

## 2013-01-25 DIAGNOSIS — R109 Unspecified abdominal pain: Secondary | ICD-10-CM | POA: Diagnosis not present

## 2013-01-25 DIAGNOSIS — F411 Generalized anxiety disorder: Secondary | ICD-10-CM | POA: Diagnosis not present

## 2013-01-25 DIAGNOSIS — Z79899 Other long term (current) drug therapy: Secondary | ICD-10-CM | POA: Diagnosis not present

## 2013-01-25 DIAGNOSIS — K3184 Gastroparesis: Secondary | ICD-10-CM | POA: Diagnosis not present

## 2013-01-25 DIAGNOSIS — G8929 Other chronic pain: Secondary | ICD-10-CM | POA: Diagnosis not present

## 2013-01-25 DIAGNOSIS — I10 Essential (primary) hypertension: Secondary | ICD-10-CM | POA: Diagnosis not present

## 2013-01-25 DIAGNOSIS — I2510 Atherosclerotic heart disease of native coronary artery without angina pectoris: Secondary | ICD-10-CM | POA: Diagnosis not present

## 2013-01-25 DIAGNOSIS — R110 Nausea: Secondary | ICD-10-CM | POA: Diagnosis not present

## 2013-01-25 DIAGNOSIS — I509 Heart failure, unspecified: Secondary | ICD-10-CM | POA: Diagnosis not present

## 2013-01-25 DIAGNOSIS — E119 Type 2 diabetes mellitus without complications: Secondary | ICD-10-CM | POA: Diagnosis not present

## 2013-01-25 DIAGNOSIS — R079 Chest pain, unspecified: Secondary | ICD-10-CM | POA: Diagnosis not present

## 2013-01-25 DIAGNOSIS — R112 Nausea with vomiting, unspecified: Secondary | ICD-10-CM | POA: Diagnosis not present

## 2013-01-25 DIAGNOSIS — M549 Dorsalgia, unspecified: Secondary | ICD-10-CM | POA: Diagnosis not present

## 2013-01-25 DIAGNOSIS — E1165 Type 2 diabetes mellitus with hyperglycemia: Secondary | ICD-10-CM | POA: Diagnosis not present

## 2013-01-25 DIAGNOSIS — K3189 Other diseases of stomach and duodenum: Secondary | ICD-10-CM | POA: Diagnosis not present

## 2013-01-25 DIAGNOSIS — R1013 Epigastric pain: Secondary | ICD-10-CM | POA: Diagnosis not present

## 2013-01-25 DIAGNOSIS — R0789 Other chest pain: Secondary | ICD-10-CM | POA: Diagnosis not present

## 2013-01-26 DIAGNOSIS — R1013 Epigastric pain: Secondary | ICD-10-CM | POA: Diagnosis not present

## 2013-01-26 DIAGNOSIS — I2510 Atherosclerotic heart disease of native coronary artery without angina pectoris: Secondary | ICD-10-CM | POA: Diagnosis not present

## 2013-01-26 DIAGNOSIS — I5020 Unspecified systolic (congestive) heart failure: Secondary | ICD-10-CM | POA: Diagnosis not present

## 2013-01-26 DIAGNOSIS — R112 Nausea with vomiting, unspecified: Secondary | ICD-10-CM | POA: Diagnosis not present

## 2013-01-26 DIAGNOSIS — E1165 Type 2 diabetes mellitus with hyperglycemia: Secondary | ICD-10-CM | POA: Diagnosis not present

## 2013-01-27 DIAGNOSIS — I2510 Atherosclerotic heart disease of native coronary artery without angina pectoris: Secondary | ICD-10-CM | POA: Diagnosis not present

## 2013-01-27 DIAGNOSIS — K2970 Gastritis, unspecified, without bleeding: Secondary | ICD-10-CM | POA: Diagnosis not present

## 2013-01-27 DIAGNOSIS — K2100 Gastro-esophageal reflux disease with esophagitis, without bleeding: Secondary | ICD-10-CM | POA: Diagnosis not present

## 2013-01-27 DIAGNOSIS — R112 Nausea with vomiting, unspecified: Secondary | ICD-10-CM | POA: Diagnosis not present

## 2013-01-27 DIAGNOSIS — N281 Cyst of kidney, acquired: Secondary | ICD-10-CM | POA: Diagnosis not present

## 2013-01-27 DIAGNOSIS — E1165 Type 2 diabetes mellitus with hyperglycemia: Secondary | ICD-10-CM | POA: Diagnosis not present

## 2013-01-27 DIAGNOSIS — K2940 Chronic atrophic gastritis without bleeding: Secondary | ICD-10-CM | POA: Diagnosis not present

## 2013-01-27 DIAGNOSIS — K3184 Gastroparesis: Secondary | ICD-10-CM | POA: Diagnosis not present

## 2013-01-27 DIAGNOSIS — K209 Esophagitis, unspecified without bleeding: Secondary | ICD-10-CM | POA: Diagnosis not present

## 2013-01-27 DIAGNOSIS — R1013 Epigastric pain: Secondary | ICD-10-CM | POA: Diagnosis not present

## 2013-01-28 DIAGNOSIS — K3184 Gastroparesis: Secondary | ICD-10-CM | POA: Diagnosis not present

## 2013-01-28 DIAGNOSIS — E1165 Type 2 diabetes mellitus with hyperglycemia: Secondary | ICD-10-CM | POA: Diagnosis not present

## 2013-01-28 DIAGNOSIS — R1013 Epigastric pain: Secondary | ICD-10-CM | POA: Diagnosis not present

## 2013-01-28 DIAGNOSIS — I2510 Atherosclerotic heart disease of native coronary artery without angina pectoris: Secondary | ICD-10-CM | POA: Diagnosis not present

## 2013-02-01 DIAGNOSIS — E291 Testicular hypofunction: Secondary | ICD-10-CM | POA: Diagnosis not present

## 2013-02-09 DIAGNOSIS — S2220XA Unspecified fracture of sternum, initial encounter for closed fracture: Secondary | ICD-10-CM | POA: Diagnosis not present

## 2013-02-09 DIAGNOSIS — Z6834 Body mass index (BMI) 34.0-34.9, adult: Secondary | ICD-10-CM | POA: Diagnosis not present

## 2013-02-09 DIAGNOSIS — M5030 Other cervical disc degeneration, unspecified cervical region: Secondary | ICD-10-CM | POA: Diagnosis not present

## 2013-02-09 DIAGNOSIS — G894 Chronic pain syndrome: Secondary | ICD-10-CM | POA: Diagnosis not present

## 2013-02-09 DIAGNOSIS — E291 Testicular hypofunction: Secondary | ICD-10-CM | POA: Diagnosis not present

## 2013-02-16 DIAGNOSIS — E291 Testicular hypofunction: Secondary | ICD-10-CM | POA: Diagnosis not present

## 2013-02-23 DIAGNOSIS — E291 Testicular hypofunction: Secondary | ICD-10-CM | POA: Diagnosis not present

## 2013-03-02 DIAGNOSIS — E291 Testicular hypofunction: Secondary | ICD-10-CM | POA: Diagnosis not present

## 2013-03-08 DIAGNOSIS — E291 Testicular hypofunction: Secondary | ICD-10-CM | POA: Diagnosis not present

## 2013-03-08 DIAGNOSIS — R509 Fever, unspecified: Secondary | ICD-10-CM | POA: Diagnosis not present

## 2013-03-08 DIAGNOSIS — I10 Essential (primary) hypertension: Secondary | ICD-10-CM | POA: Diagnosis not present

## 2013-03-08 DIAGNOSIS — E785 Hyperlipidemia, unspecified: Secondary | ICD-10-CM | POA: Diagnosis not present

## 2013-03-15 DIAGNOSIS — E291 Testicular hypofunction: Secondary | ICD-10-CM | POA: Diagnosis not present

## 2013-03-22 DIAGNOSIS — E291 Testicular hypofunction: Secondary | ICD-10-CM | POA: Diagnosis not present

## 2013-03-26 DIAGNOSIS — E119 Type 2 diabetes mellitus without complications: Secondary | ICD-10-CM | POA: Diagnosis not present

## 2013-03-27 DIAGNOSIS — I10 Essential (primary) hypertension: Secondary | ICD-10-CM | POA: Diagnosis not present

## 2013-03-27 DIAGNOSIS — R5383 Other fatigue: Secondary | ICD-10-CM | POA: Diagnosis not present

## 2013-03-27 DIAGNOSIS — I509 Heart failure, unspecified: Secondary | ICD-10-CM | POA: Diagnosis not present

## 2013-03-27 DIAGNOSIS — Z951 Presence of aortocoronary bypass graft: Secondary | ICD-10-CM | POA: Diagnosis not present

## 2013-03-27 DIAGNOSIS — R079 Chest pain, unspecified: Secondary | ICD-10-CM | POA: Diagnosis not present

## 2013-03-27 DIAGNOSIS — J449 Chronic obstructive pulmonary disease, unspecified: Secondary | ICD-10-CM | POA: Diagnosis not present

## 2013-03-27 DIAGNOSIS — R7309 Other abnormal glucose: Secondary | ICD-10-CM | POA: Diagnosis not present

## 2013-03-27 DIAGNOSIS — E1169 Type 2 diabetes mellitus with other specified complication: Secondary | ICD-10-CM | POA: Diagnosis not present

## 2013-03-27 DIAGNOSIS — R5381 Other malaise: Secondary | ICD-10-CM | POA: Diagnosis not present

## 2013-03-29 DIAGNOSIS — E291 Testicular hypofunction: Secondary | ICD-10-CM | POA: Diagnosis not present

## 2013-04-05 DIAGNOSIS — S2220XA Unspecified fracture of sternum, initial encounter for closed fracture: Secondary | ICD-10-CM | POA: Diagnosis not present

## 2013-04-05 DIAGNOSIS — G894 Chronic pain syndrome: Secondary | ICD-10-CM | POA: Diagnosis not present

## 2013-04-05 DIAGNOSIS — E291 Testicular hypofunction: Secondary | ICD-10-CM | POA: Diagnosis not present

## 2013-04-05 DIAGNOSIS — M5030 Other cervical disc degeneration, unspecified cervical region: Secondary | ICD-10-CM | POA: Diagnosis not present

## 2013-04-05 DIAGNOSIS — Z6833 Body mass index (BMI) 33.0-33.9, adult: Secondary | ICD-10-CM | POA: Diagnosis not present

## 2013-04-12 DIAGNOSIS — E291 Testicular hypofunction: Secondary | ICD-10-CM | POA: Diagnosis not present

## 2013-04-15 DIAGNOSIS — J209 Acute bronchitis, unspecified: Secondary | ICD-10-CM | POA: Diagnosis not present

## 2013-04-19 DIAGNOSIS — E291 Testicular hypofunction: Secondary | ICD-10-CM | POA: Diagnosis not present

## 2013-04-26 DIAGNOSIS — E291 Testicular hypofunction: Secondary | ICD-10-CM | POA: Diagnosis not present

## 2013-05-10 DIAGNOSIS — E291 Testicular hypofunction: Secondary | ICD-10-CM | POA: Diagnosis not present

## 2013-05-17 DIAGNOSIS — E291 Testicular hypofunction: Secondary | ICD-10-CM | POA: Diagnosis not present

## 2013-05-24 DIAGNOSIS — E291 Testicular hypofunction: Secondary | ICD-10-CM | POA: Diagnosis not present

## 2013-05-31 DIAGNOSIS — I10 Essential (primary) hypertension: Secondary | ICD-10-CM | POA: Diagnosis not present

## 2013-05-31 DIAGNOSIS — G894 Chronic pain syndrome: Secondary | ICD-10-CM | POA: Diagnosis not present

## 2013-05-31 DIAGNOSIS — Z6833 Body mass index (BMI) 33.0-33.9, adult: Secondary | ICD-10-CM | POA: Diagnosis not present

## 2013-05-31 DIAGNOSIS — E291 Testicular hypofunction: Secondary | ICD-10-CM | POA: Diagnosis not present

## 2013-05-31 DIAGNOSIS — E785 Hyperlipidemia, unspecified: Secondary | ICD-10-CM | POA: Diagnosis not present

## 2013-05-31 DIAGNOSIS — IMO0001 Reserved for inherently not codable concepts without codable children: Secondary | ICD-10-CM | POA: Diagnosis not present

## 2013-06-07 DIAGNOSIS — E291 Testicular hypofunction: Secondary | ICD-10-CM | POA: Diagnosis not present

## 2013-06-14 DIAGNOSIS — E291 Testicular hypofunction: Secondary | ICD-10-CM | POA: Diagnosis not present

## 2013-06-21 DIAGNOSIS — E291 Testicular hypofunction: Secondary | ICD-10-CM | POA: Diagnosis not present

## 2013-06-28 DIAGNOSIS — E785 Hyperlipidemia, unspecified: Secondary | ICD-10-CM | POA: Diagnosis not present

## 2013-06-28 DIAGNOSIS — I10 Essential (primary) hypertension: Secondary | ICD-10-CM | POA: Diagnosis not present

## 2013-06-28 DIAGNOSIS — IMO0001 Reserved for inherently not codable concepts without codable children: Secondary | ICD-10-CM | POA: Diagnosis not present

## 2013-06-28 DIAGNOSIS — E291 Testicular hypofunction: Secondary | ICD-10-CM | POA: Diagnosis not present

## 2013-06-28 DIAGNOSIS — Z6833 Body mass index (BMI) 33.0-33.9, adult: Secondary | ICD-10-CM | POA: Diagnosis not present

## 2013-07-05 DIAGNOSIS — E291 Testicular hypofunction: Secondary | ICD-10-CM | POA: Diagnosis not present

## 2013-07-12 DIAGNOSIS — E291 Testicular hypofunction: Secondary | ICD-10-CM | POA: Diagnosis not present

## 2013-07-19 DIAGNOSIS — E291 Testicular hypofunction: Secondary | ICD-10-CM | POA: Diagnosis not present

## 2013-07-26 DIAGNOSIS — M5030 Other cervical disc degeneration, unspecified cervical region: Secondary | ICD-10-CM | POA: Diagnosis not present

## 2013-07-26 DIAGNOSIS — S2220XA Unspecified fracture of sternum, initial encounter for closed fracture: Secondary | ICD-10-CM | POA: Diagnosis not present

## 2013-07-26 DIAGNOSIS — E291 Testicular hypofunction: Secondary | ICD-10-CM | POA: Diagnosis not present

## 2013-07-26 DIAGNOSIS — G894 Chronic pain syndrome: Secondary | ICD-10-CM | POA: Diagnosis not present

## 2013-07-26 DIAGNOSIS — M5137 Other intervertebral disc degeneration, lumbosacral region: Secondary | ICD-10-CM | POA: Diagnosis not present

## 2013-08-02 DIAGNOSIS — E291 Testicular hypofunction: Secondary | ICD-10-CM | POA: Diagnosis not present

## 2013-08-09 DIAGNOSIS — E291 Testicular hypofunction: Secondary | ICD-10-CM | POA: Diagnosis not present

## 2013-08-16 DIAGNOSIS — E291 Testicular hypofunction: Secondary | ICD-10-CM | POA: Diagnosis not present

## 2013-08-23 DIAGNOSIS — S2220XA Unspecified fracture of sternum, initial encounter for closed fracture: Secondary | ICD-10-CM | POA: Diagnosis not present

## 2013-08-23 DIAGNOSIS — E291 Testicular hypofunction: Secondary | ICD-10-CM | POA: Diagnosis not present

## 2013-08-23 DIAGNOSIS — M5030 Other cervical disc degeneration, unspecified cervical region: Secondary | ICD-10-CM | POA: Diagnosis not present

## 2013-08-23 DIAGNOSIS — Z6833 Body mass index (BMI) 33.0-33.9, adult: Secondary | ICD-10-CM | POA: Diagnosis not present

## 2013-08-23 DIAGNOSIS — G894 Chronic pain syndrome: Secondary | ICD-10-CM | POA: Diagnosis not present

## 2013-08-30 DIAGNOSIS — E291 Testicular hypofunction: Secondary | ICD-10-CM | POA: Diagnosis not present

## 2013-09-06 DIAGNOSIS — E291 Testicular hypofunction: Secondary | ICD-10-CM | POA: Diagnosis not present

## 2013-09-13 DIAGNOSIS — E291 Testicular hypofunction: Secondary | ICD-10-CM | POA: Diagnosis not present

## 2013-09-20 DIAGNOSIS — Z6834 Body mass index (BMI) 34.0-34.9, adult: Secondary | ICD-10-CM | POA: Diagnosis not present

## 2013-09-20 DIAGNOSIS — G894 Chronic pain syndrome: Secondary | ICD-10-CM | POA: Diagnosis not present

## 2013-09-20 DIAGNOSIS — E291 Testicular hypofunction: Secondary | ICD-10-CM | POA: Diagnosis not present

## 2013-09-20 DIAGNOSIS — M5030 Other cervical disc degeneration, unspecified cervical region: Secondary | ICD-10-CM | POA: Diagnosis not present

## 2013-09-20 DIAGNOSIS — S2220XA Unspecified fracture of sternum, initial encounter for closed fracture: Secondary | ICD-10-CM | POA: Diagnosis not present

## 2013-09-27 DIAGNOSIS — Z6834 Body mass index (BMI) 34.0-34.9, adult: Secondary | ICD-10-CM | POA: Diagnosis not present

## 2013-09-27 DIAGNOSIS — E1149 Type 2 diabetes mellitus with other diabetic neurological complication: Secondary | ICD-10-CM | POA: Diagnosis not present

## 2013-09-27 DIAGNOSIS — E291 Testicular hypofunction: Secondary | ICD-10-CM | POA: Diagnosis not present

## 2013-10-04 DIAGNOSIS — E291 Testicular hypofunction: Secondary | ICD-10-CM | POA: Diagnosis not present

## 2013-10-08 DIAGNOSIS — K2970 Gastritis, unspecified, without bleeding: Secondary | ICD-10-CM | POA: Diagnosis not present

## 2013-10-08 DIAGNOSIS — R0789 Other chest pain: Secondary | ICD-10-CM | POA: Diagnosis not present

## 2013-10-08 DIAGNOSIS — R141 Gas pain: Secondary | ICD-10-CM | POA: Diagnosis not present

## 2013-10-08 DIAGNOSIS — K2990 Gastroduodenitis, unspecified, without bleeding: Secondary | ICD-10-CM | POA: Diagnosis not present

## 2013-10-08 DIAGNOSIS — K3189 Other diseases of stomach and duodenum: Secondary | ICD-10-CM | POA: Diagnosis not present

## 2013-10-08 DIAGNOSIS — R079 Chest pain, unspecified: Secondary | ICD-10-CM | POA: Diagnosis not present

## 2013-10-08 DIAGNOSIS — K3184 Gastroparesis: Secondary | ICD-10-CM | POA: Diagnosis not present

## 2013-10-08 DIAGNOSIS — R109 Unspecified abdominal pain: Secondary | ICD-10-CM | POA: Diagnosis not present

## 2013-10-08 DIAGNOSIS — R1013 Epigastric pain: Secondary | ICD-10-CM | POA: Diagnosis not present

## 2013-10-11 DIAGNOSIS — E291 Testicular hypofunction: Secondary | ICD-10-CM | POA: Diagnosis not present

## 2013-10-18 DIAGNOSIS — E785 Hyperlipidemia, unspecified: Secondary | ICD-10-CM | POA: Diagnosis not present

## 2013-10-18 DIAGNOSIS — I10 Essential (primary) hypertension: Secondary | ICD-10-CM | POA: Diagnosis not present

## 2013-10-18 DIAGNOSIS — E291 Testicular hypofunction: Secondary | ICD-10-CM | POA: Diagnosis not present

## 2013-10-18 DIAGNOSIS — Z6836 Body mass index (BMI) 36.0-36.9, adult: Secondary | ICD-10-CM | POA: Diagnosis not present

## 2013-10-18 DIAGNOSIS — E1149 Type 2 diabetes mellitus with other diabetic neurological complication: Secondary | ICD-10-CM | POA: Diagnosis not present

## 2013-10-25 DIAGNOSIS — E291 Testicular hypofunction: Secondary | ICD-10-CM | POA: Diagnosis not present

## 2013-11-01 DIAGNOSIS — E291 Testicular hypofunction: Secondary | ICD-10-CM | POA: Diagnosis not present

## 2013-11-08 DIAGNOSIS — E291 Testicular hypofunction: Secondary | ICD-10-CM | POA: Diagnosis not present

## 2013-11-09 DIAGNOSIS — J449 Chronic obstructive pulmonary disease, unspecified: Secondary | ICD-10-CM | POA: Diagnosis not present

## 2013-11-09 DIAGNOSIS — Z79899 Other long term (current) drug therapy: Secondary | ICD-10-CM | POA: Diagnosis not present

## 2013-11-09 DIAGNOSIS — M79609 Pain in unspecified limb: Secondary | ICD-10-CM | POA: Diagnosis not present

## 2013-11-09 DIAGNOSIS — E119 Type 2 diabetes mellitus without complications: Secondary | ICD-10-CM | POA: Diagnosis not present

## 2013-11-09 DIAGNOSIS — Z794 Long term (current) use of insulin: Secondary | ICD-10-CM | POA: Diagnosis not present

## 2013-11-09 DIAGNOSIS — I2510 Atherosclerotic heart disease of native coronary artery without angina pectoris: Secondary | ICD-10-CM | POA: Diagnosis not present

## 2013-11-09 DIAGNOSIS — K219 Gastro-esophageal reflux disease without esophagitis: Secondary | ICD-10-CM | POA: Diagnosis not present

## 2013-11-09 DIAGNOSIS — F17200 Nicotine dependence, unspecified, uncomplicated: Secondary | ICD-10-CM | POA: Diagnosis not present

## 2013-11-09 DIAGNOSIS — S8990XA Unspecified injury of unspecified lower leg, initial encounter: Secondary | ICD-10-CM | POA: Diagnosis not present

## 2013-11-09 DIAGNOSIS — S99919A Unspecified injury of unspecified ankle, initial encounter: Secondary | ICD-10-CM | POA: Diagnosis not present

## 2013-11-09 DIAGNOSIS — I10 Essential (primary) hypertension: Secondary | ICD-10-CM | POA: Diagnosis not present

## 2013-11-09 DIAGNOSIS — I4891 Unspecified atrial fibrillation: Secondary | ICD-10-CM | POA: Diagnosis not present

## 2013-11-09 DIAGNOSIS — I509 Heart failure, unspecified: Secondary | ICD-10-CM | POA: Diagnosis not present

## 2013-11-09 DIAGNOSIS — E7800 Pure hypercholesterolemia, unspecified: Secondary | ICD-10-CM | POA: Diagnosis not present

## 2013-11-15 DIAGNOSIS — E291 Testicular hypofunction: Secondary | ICD-10-CM | POA: Diagnosis not present

## 2013-11-15 DIAGNOSIS — Z6834 Body mass index (BMI) 34.0-34.9, adult: Secondary | ICD-10-CM | POA: Diagnosis not present

## 2013-11-15 DIAGNOSIS — I10 Essential (primary) hypertension: Secondary | ICD-10-CM | POA: Diagnosis not present

## 2013-11-15 DIAGNOSIS — G894 Chronic pain syndrome: Secondary | ICD-10-CM | POA: Diagnosis not present

## 2013-11-22 DIAGNOSIS — E291 Testicular hypofunction: Secondary | ICD-10-CM | POA: Diagnosis not present

## 2013-11-29 DIAGNOSIS — E291 Testicular hypofunction: Secondary | ICD-10-CM | POA: Diagnosis not present

## 2013-12-06 DIAGNOSIS — E291 Testicular hypofunction: Secondary | ICD-10-CM | POA: Diagnosis not present

## 2013-12-13 DIAGNOSIS — Z6834 Body mass index (BMI) 34.0-34.9, adult: Secondary | ICD-10-CM | POA: Diagnosis not present

## 2013-12-13 DIAGNOSIS — S2220XA Unspecified fracture of sternum, initial encounter for closed fracture: Secondary | ICD-10-CM | POA: Diagnosis not present

## 2013-12-13 DIAGNOSIS — G8929 Other chronic pain: Secondary | ICD-10-CM | POA: Diagnosis not present

## 2013-12-13 DIAGNOSIS — M5030 Other cervical disc degeneration, unspecified cervical region: Secondary | ICD-10-CM | POA: Diagnosis not present

## 2013-12-20 DIAGNOSIS — Z6833 Body mass index (BMI) 33.0-33.9, adult: Secondary | ICD-10-CM | POA: Diagnosis not present

## 2013-12-20 DIAGNOSIS — N4822 Cellulitis of corpus cavernosum and penis: Secondary | ICD-10-CM | POA: Diagnosis not present

## 2013-12-20 DIAGNOSIS — E291 Testicular hypofunction: Secondary | ICD-10-CM | POA: Diagnosis not present

## 2014-01-03 DIAGNOSIS — E291 Testicular hypofunction: Secondary | ICD-10-CM | POA: Diagnosis not present

## 2014-01-10 DIAGNOSIS — E291 Testicular hypofunction: Secondary | ICD-10-CM | POA: Diagnosis not present

## 2014-01-10 DIAGNOSIS — Z6833 Body mass index (BMI) 33.0-33.9, adult: Secondary | ICD-10-CM | POA: Diagnosis not present

## 2014-01-10 DIAGNOSIS — S2220XA Unspecified fracture of sternum, initial encounter for closed fracture: Secondary | ICD-10-CM | POA: Diagnosis not present

## 2014-01-10 DIAGNOSIS — I10 Essential (primary) hypertension: Secondary | ICD-10-CM | POA: Diagnosis not present

## 2014-01-10 DIAGNOSIS — G8929 Other chronic pain: Secondary | ICD-10-CM | POA: Diagnosis not present

## 2014-01-10 DIAGNOSIS — M5030 Other cervical disc degeneration, unspecified cervical region: Secondary | ICD-10-CM | POA: Diagnosis not present

## 2014-01-10 DIAGNOSIS — M5136 Other intervertebral disc degeneration, lumbar region: Secondary | ICD-10-CM | POA: Diagnosis not present

## 2014-01-17 DIAGNOSIS — E291 Testicular hypofunction: Secondary | ICD-10-CM | POA: Diagnosis not present

## 2014-01-31 DIAGNOSIS — E291 Testicular hypofunction: Secondary | ICD-10-CM | POA: Diagnosis not present

## 2014-02-03 DIAGNOSIS — Z6833 Body mass index (BMI) 33.0-33.9, adult: Secondary | ICD-10-CM | POA: Diagnosis not present

## 2014-02-03 DIAGNOSIS — J209 Acute bronchitis, unspecified: Secondary | ICD-10-CM | POA: Diagnosis not present

## 2014-02-03 DIAGNOSIS — Z760 Encounter for issue of repeat prescription: Secondary | ICD-10-CM | POA: Diagnosis not present

## 2014-02-05 DIAGNOSIS — M19019 Primary osteoarthritis, unspecified shoulder: Secondary | ICD-10-CM | POA: Diagnosis present

## 2014-02-05 DIAGNOSIS — Z951 Presence of aortocoronary bypass graft: Secondary | ICD-10-CM | POA: Diagnosis not present

## 2014-02-05 DIAGNOSIS — Z7982 Long term (current) use of aspirin: Secondary | ICD-10-CM | POA: Diagnosis not present

## 2014-02-05 DIAGNOSIS — K219 Gastro-esophageal reflux disease without esophagitis: Secondary | ICD-10-CM | POA: Diagnosis not present

## 2014-02-05 DIAGNOSIS — Z794 Long term (current) use of insulin: Secondary | ICD-10-CM | POA: Diagnosis not present

## 2014-02-05 DIAGNOSIS — Z87891 Personal history of nicotine dependence: Secondary | ICD-10-CM | POA: Diagnosis not present

## 2014-02-05 DIAGNOSIS — I10 Essential (primary) hypertension: Secondary | ICD-10-CM | POA: Diagnosis present

## 2014-02-05 DIAGNOSIS — R079 Chest pain, unspecified: Secondary | ICD-10-CM | POA: Diagnosis not present

## 2014-02-05 DIAGNOSIS — J180 Bronchopneumonia, unspecified organism: Secondary | ICD-10-CM | POA: Diagnosis not present

## 2014-02-05 DIAGNOSIS — R0902 Hypoxemia: Secondary | ICD-10-CM | POA: Diagnosis not present

## 2014-02-05 DIAGNOSIS — E780 Pure hypercholesterolemia: Secondary | ICD-10-CM | POA: Diagnosis present

## 2014-02-05 DIAGNOSIS — J9811 Atelectasis: Secondary | ICD-10-CM | POA: Diagnosis not present

## 2014-02-05 DIAGNOSIS — E1165 Type 2 diabetes mellitus with hyperglycemia: Secondary | ICD-10-CM | POA: Diagnosis not present

## 2014-02-05 DIAGNOSIS — Z888 Allergy status to other drugs, medicaments and biological substances status: Secondary | ICD-10-CM | POA: Diagnosis not present

## 2014-02-05 DIAGNOSIS — R0602 Shortness of breath: Secondary | ICD-10-CM | POA: Diagnosis not present

## 2014-02-05 DIAGNOSIS — Z95818 Presence of other cardiac implants and grafts: Secondary | ICD-10-CM | POA: Diagnosis not present

## 2014-02-05 DIAGNOSIS — F329 Major depressive disorder, single episode, unspecified: Secondary | ICD-10-CM | POA: Diagnosis not present

## 2014-02-05 DIAGNOSIS — Z881 Allergy status to other antibiotic agents status: Secondary | ICD-10-CM | POA: Diagnosis not present

## 2014-02-05 DIAGNOSIS — R071 Chest pain on breathing: Secondary | ICD-10-CM | POA: Diagnosis not present

## 2014-02-05 DIAGNOSIS — Z9981 Dependence on supplemental oxygen: Secondary | ICD-10-CM | POA: Diagnosis not present

## 2014-02-05 DIAGNOSIS — R0989 Other specified symptoms and signs involving the circulatory and respiratory systems: Secondary | ICD-10-CM | POA: Diagnosis not present

## 2014-02-05 DIAGNOSIS — E1142 Type 2 diabetes mellitus with diabetic polyneuropathy: Secondary | ICD-10-CM | POA: Diagnosis not present

## 2014-02-05 DIAGNOSIS — I252 Old myocardial infarction: Secondary | ICD-10-CM | POA: Diagnosis not present

## 2014-02-05 DIAGNOSIS — J9621 Acute and chronic respiratory failure with hypoxia: Secondary | ICD-10-CM | POA: Diagnosis not present

## 2014-02-05 DIAGNOSIS — J441 Chronic obstructive pulmonary disease with (acute) exacerbation: Secondary | ICD-10-CM | POA: Diagnosis not present

## 2014-02-05 DIAGNOSIS — I509 Heart failure, unspecified: Secondary | ICD-10-CM | POA: Diagnosis not present

## 2014-02-05 DIAGNOSIS — I2510 Atherosclerotic heart disease of native coronary artery without angina pectoris: Secondary | ICD-10-CM | POA: Diagnosis not present

## 2014-02-05 DIAGNOSIS — Z91040 Latex allergy status: Secondary | ICD-10-CM | POA: Diagnosis not present

## 2014-02-05 DIAGNOSIS — Z9861 Coronary angioplasty status: Secondary | ICD-10-CM | POA: Diagnosis not present

## 2014-02-05 DIAGNOSIS — R05 Cough: Secondary | ICD-10-CM | POA: Diagnosis not present

## 2014-02-05 DIAGNOSIS — J45909 Unspecified asthma, uncomplicated: Secondary | ICD-10-CM | POA: Diagnosis present

## 2014-02-05 DIAGNOSIS — R9431 Abnormal electrocardiogram [ECG] [EKG]: Secondary | ICD-10-CM | POA: Diagnosis not present

## 2014-02-05 DIAGNOSIS — Z23 Encounter for immunization: Secondary | ICD-10-CM | POA: Diagnosis not present

## 2014-02-14 DIAGNOSIS — Z6832 Body mass index (BMI) 32.0-32.9, adult: Secondary | ICD-10-CM | POA: Diagnosis not present

## 2014-02-14 DIAGNOSIS — E291 Testicular hypofunction: Secondary | ICD-10-CM | POA: Diagnosis not present

## 2014-02-14 DIAGNOSIS — J180 Bronchopneumonia, unspecified organism: Secondary | ICD-10-CM | POA: Diagnosis not present

## 2014-02-14 DIAGNOSIS — J441 Chronic obstructive pulmonary disease with (acute) exacerbation: Secondary | ICD-10-CM | POA: Diagnosis not present

## 2014-02-14 DIAGNOSIS — J9621 Acute and chronic respiratory failure with hypoxia: Secondary | ICD-10-CM | POA: Diagnosis not present

## 2014-02-14 DIAGNOSIS — E1140 Type 2 diabetes mellitus with diabetic neuropathy, unspecified: Secondary | ICD-10-CM | POA: Diagnosis not present

## 2014-02-28 DIAGNOSIS — E291 Testicular hypofunction: Secondary | ICD-10-CM | POA: Diagnosis not present

## 2014-03-07 DIAGNOSIS — Z125 Encounter for screening for malignant neoplasm of prostate: Secondary | ICD-10-CM | POA: Diagnosis not present

## 2014-03-07 DIAGNOSIS — M5136 Other intervertebral disc degeneration, lumbar region: Secondary | ICD-10-CM | POA: Diagnosis not present

## 2014-03-07 DIAGNOSIS — S2220XA Unspecified fracture of sternum, initial encounter for closed fracture: Secondary | ICD-10-CM | POA: Diagnosis not present

## 2014-03-07 DIAGNOSIS — I10 Essential (primary) hypertension: Secondary | ICD-10-CM | POA: Diagnosis not present

## 2014-03-07 DIAGNOSIS — E1140 Type 2 diabetes mellitus with diabetic neuropathy, unspecified: Secondary | ICD-10-CM | POA: Diagnosis not present

## 2014-03-07 DIAGNOSIS — M5030 Other cervical disc degeneration, unspecified cervical region: Secondary | ICD-10-CM | POA: Diagnosis not present

## 2014-03-07 DIAGNOSIS — E785 Hyperlipidemia, unspecified: Secondary | ICD-10-CM | POA: Diagnosis not present

## 2014-03-07 DIAGNOSIS — G8929 Other chronic pain: Secondary | ICD-10-CM | POA: Diagnosis not present

## 2014-03-07 DIAGNOSIS — E291 Testicular hypofunction: Secondary | ICD-10-CM | POA: Diagnosis not present

## 2014-03-14 DIAGNOSIS — E291 Testicular hypofunction: Secondary | ICD-10-CM | POA: Diagnosis not present

## 2014-03-15 DIAGNOSIS — Z6832 Body mass index (BMI) 32.0-32.9, adult: Secondary | ICD-10-CM | POA: Diagnosis not present

## 2014-03-15 DIAGNOSIS — J019 Acute sinusitis, unspecified: Secondary | ICD-10-CM | POA: Diagnosis not present

## 2014-03-28 DIAGNOSIS — E291 Testicular hypofunction: Secondary | ICD-10-CM | POA: Diagnosis not present

## 2014-04-04 DIAGNOSIS — M5136 Other intervertebral disc degeneration, lumbar region: Secondary | ICD-10-CM | POA: Diagnosis not present

## 2014-04-04 DIAGNOSIS — Z6834 Body mass index (BMI) 34.0-34.9, adult: Secondary | ICD-10-CM | POA: Diagnosis not present

## 2014-04-04 DIAGNOSIS — M5030 Other cervical disc degeneration, unspecified cervical region: Secondary | ICD-10-CM | POA: Diagnosis not present

## 2014-04-04 DIAGNOSIS — E291 Testicular hypofunction: Secondary | ICD-10-CM | POA: Diagnosis not present

## 2014-04-04 DIAGNOSIS — I10 Essential (primary) hypertension: Secondary | ICD-10-CM | POA: Diagnosis not present

## 2014-04-04 DIAGNOSIS — S2220XA Unspecified fracture of sternum, initial encounter for closed fracture: Secondary | ICD-10-CM | POA: Diagnosis not present

## 2014-04-04 DIAGNOSIS — G8929 Other chronic pain: Secondary | ICD-10-CM | POA: Diagnosis not present

## 2014-04-11 DIAGNOSIS — E291 Testicular hypofunction: Secondary | ICD-10-CM | POA: Diagnosis not present

## 2014-04-25 DIAGNOSIS — E291 Testicular hypofunction: Secondary | ICD-10-CM | POA: Diagnosis not present

## 2014-05-02 DIAGNOSIS — M5030 Other cervical disc degeneration, unspecified cervical region: Secondary | ICD-10-CM | POA: Diagnosis not present

## 2014-05-02 DIAGNOSIS — E291 Testicular hypofunction: Secondary | ICD-10-CM | POA: Diagnosis not present

## 2014-05-02 DIAGNOSIS — I10 Essential (primary) hypertension: Secondary | ICD-10-CM | POA: Diagnosis not present

## 2014-05-02 DIAGNOSIS — Z6833 Body mass index (BMI) 33.0-33.9, adult: Secondary | ICD-10-CM | POA: Diagnosis not present

## 2014-05-02 DIAGNOSIS — M5136 Other intervertebral disc degeneration, lumbar region: Secondary | ICD-10-CM | POA: Diagnosis not present

## 2014-05-02 DIAGNOSIS — S2220XA Unspecified fracture of sternum, initial encounter for closed fracture: Secondary | ICD-10-CM | POA: Diagnosis not present

## 2014-05-02 DIAGNOSIS — G8929 Other chronic pain: Secondary | ICD-10-CM | POA: Diagnosis not present

## 2014-05-09 DIAGNOSIS — M5136 Other intervertebral disc degeneration, lumbar region: Secondary | ICD-10-CM | POA: Diagnosis not present

## 2014-05-09 DIAGNOSIS — E291 Testicular hypofunction: Secondary | ICD-10-CM | POA: Diagnosis not present

## 2014-05-09 DIAGNOSIS — M47812 Spondylosis without myelopathy or radiculopathy, cervical region: Secondary | ICD-10-CM | POA: Diagnosis not present

## 2014-05-09 DIAGNOSIS — S2220XA Unspecified fracture of sternum, initial encounter for closed fracture: Secondary | ICD-10-CM | POA: Diagnosis not present

## 2014-05-09 DIAGNOSIS — M5030 Other cervical disc degeneration, unspecified cervical region: Secondary | ICD-10-CM | POA: Diagnosis not present

## 2014-05-23 DIAGNOSIS — E291 Testicular hypofunction: Secondary | ICD-10-CM | POA: Diagnosis not present

## 2014-05-25 DIAGNOSIS — R0789 Other chest pain: Secondary | ICD-10-CM | POA: Diagnosis not present

## 2014-05-25 DIAGNOSIS — Z951 Presence of aortocoronary bypass graft: Secondary | ICD-10-CM | POA: Diagnosis not present

## 2014-05-25 DIAGNOSIS — S2222XS Fracture of body of sternum, sequela: Secondary | ICD-10-CM | POA: Diagnosis not present

## 2014-05-25 DIAGNOSIS — I97130 Postprocedural heart failure following cardiac surgery: Secondary | ICD-10-CM | POA: Diagnosis not present

## 2014-05-25 DIAGNOSIS — R911 Solitary pulmonary nodule: Secondary | ICD-10-CM | POA: Diagnosis not present

## 2014-05-30 DIAGNOSIS — E291 Testicular hypofunction: Secondary | ICD-10-CM | POA: Diagnosis not present

## 2014-05-30 DIAGNOSIS — M5090 Cervical disc disorder, unspecified, unspecified cervical region: Secondary | ICD-10-CM | POA: Diagnosis not present

## 2014-05-30 DIAGNOSIS — S2220XA Unspecified fracture of sternum, initial encounter for closed fracture: Secondary | ICD-10-CM | POA: Diagnosis not present

## 2014-05-30 DIAGNOSIS — Z6834 Body mass index (BMI) 34.0-34.9, adult: Secondary | ICD-10-CM | POA: Diagnosis not present

## 2014-05-30 DIAGNOSIS — I10 Essential (primary) hypertension: Secondary | ICD-10-CM | POA: Diagnosis not present

## 2014-05-30 DIAGNOSIS — G8929 Other chronic pain: Secondary | ICD-10-CM | POA: Diagnosis not present

## 2014-05-30 DIAGNOSIS — M5136 Other intervertebral disc degeneration, lumbar region: Secondary | ICD-10-CM | POA: Diagnosis not present

## 2014-06-06 DIAGNOSIS — E291 Testicular hypofunction: Secondary | ICD-10-CM | POA: Diagnosis not present

## 2014-06-12 DIAGNOSIS — Z6834 Body mass index (BMI) 34.0-34.9, adult: Secondary | ICD-10-CM | POA: Diagnosis not present

## 2014-06-12 DIAGNOSIS — I10 Essential (primary) hypertension: Secondary | ICD-10-CM | POA: Diagnosis not present

## 2014-06-12 DIAGNOSIS — J028 Acute pharyngitis due to other specified organisms: Secondary | ICD-10-CM | POA: Diagnosis not present

## 2014-06-16 DIAGNOSIS — E119 Type 2 diabetes mellitus without complications: Secondary | ICD-10-CM | POA: Diagnosis not present

## 2014-06-16 DIAGNOSIS — Z794 Long term (current) use of insulin: Secondary | ICD-10-CM | POA: Diagnosis not present

## 2014-06-16 DIAGNOSIS — K0510 Chronic gingivitis, plaque induced: Secondary | ICD-10-CM | POA: Diagnosis not present

## 2014-06-16 DIAGNOSIS — I10 Essential (primary) hypertension: Secondary | ICD-10-CM | POA: Diagnosis not present

## 2014-06-16 DIAGNOSIS — Z7982 Long term (current) use of aspirin: Secondary | ICD-10-CM | POA: Diagnosis not present

## 2014-06-16 DIAGNOSIS — Z792 Long term (current) use of antibiotics: Secondary | ICD-10-CM | POA: Diagnosis not present

## 2014-06-16 DIAGNOSIS — Z87891 Personal history of nicotine dependence: Secondary | ICD-10-CM | POA: Diagnosis not present

## 2014-06-20 DIAGNOSIS — Z6834 Body mass index (BMI) 34.0-34.9, adult: Secondary | ICD-10-CM | POA: Diagnosis not present

## 2014-06-20 DIAGNOSIS — I10 Essential (primary) hypertension: Secondary | ICD-10-CM | POA: Diagnosis not present

## 2014-06-20 DIAGNOSIS — E291 Testicular hypofunction: Secondary | ICD-10-CM | POA: Diagnosis not present

## 2014-06-27 DIAGNOSIS — E785 Hyperlipidemia, unspecified: Secondary | ICD-10-CM | POA: Diagnosis not present

## 2014-06-27 DIAGNOSIS — M5090 Cervical disc disorder, unspecified, unspecified cervical region: Secondary | ICD-10-CM | POA: Diagnosis not present

## 2014-06-27 DIAGNOSIS — E291 Testicular hypofunction: Secondary | ICD-10-CM | POA: Diagnosis not present

## 2014-06-27 DIAGNOSIS — Z6834 Body mass index (BMI) 34.0-34.9, adult: Secondary | ICD-10-CM | POA: Diagnosis not present

## 2014-06-27 DIAGNOSIS — I10 Essential (primary) hypertension: Secondary | ICD-10-CM | POA: Diagnosis not present

## 2014-06-27 DIAGNOSIS — M5136 Other intervertebral disc degeneration, lumbar region: Secondary | ICD-10-CM | POA: Diagnosis not present

## 2014-06-27 DIAGNOSIS — E1140 Type 2 diabetes mellitus with diabetic neuropathy, unspecified: Secondary | ICD-10-CM | POA: Diagnosis not present

## 2014-06-27 DIAGNOSIS — Z760 Encounter for issue of repeat prescription: Secondary | ICD-10-CM | POA: Diagnosis not present

## 2014-06-27 DIAGNOSIS — G8929 Other chronic pain: Secondary | ICD-10-CM | POA: Diagnosis not present

## 2014-06-27 DIAGNOSIS — S2220XA Unspecified fracture of sternum, initial encounter for closed fracture: Secondary | ICD-10-CM | POA: Diagnosis not present

## 2014-07-04 DIAGNOSIS — E291 Testicular hypofunction: Secondary | ICD-10-CM | POA: Diagnosis not present

## 2014-07-18 DIAGNOSIS — E291 Testicular hypofunction: Secondary | ICD-10-CM | POA: Diagnosis not present

## 2014-07-25 DIAGNOSIS — G8929 Other chronic pain: Secondary | ICD-10-CM | POA: Diagnosis not present

## 2014-07-25 DIAGNOSIS — S2220XA Unspecified fracture of sternum, initial encounter for closed fracture: Secondary | ICD-10-CM | POA: Diagnosis not present

## 2014-07-25 DIAGNOSIS — E291 Testicular hypofunction: Secondary | ICD-10-CM | POA: Diagnosis not present

## 2014-07-25 DIAGNOSIS — I10 Essential (primary) hypertension: Secondary | ICD-10-CM | POA: Diagnosis not present

## 2014-07-25 DIAGNOSIS — M5136 Other intervertebral disc degeneration, lumbar region: Secondary | ICD-10-CM | POA: Diagnosis not present

## 2014-07-25 DIAGNOSIS — E1140 Type 2 diabetes mellitus with diabetic neuropathy, unspecified: Secondary | ICD-10-CM | POA: Diagnosis not present

## 2014-07-25 DIAGNOSIS — Z79899 Other long term (current) drug therapy: Secondary | ICD-10-CM | POA: Diagnosis not present

## 2014-07-25 DIAGNOSIS — Z6833 Body mass index (BMI) 33.0-33.9, adult: Secondary | ICD-10-CM | POA: Diagnosis not present

## 2014-07-25 DIAGNOSIS — M5090 Cervical disc disorder, unspecified, unspecified cervical region: Secondary | ICD-10-CM | POA: Diagnosis not present

## 2014-08-01 DIAGNOSIS — E291 Testicular hypofunction: Secondary | ICD-10-CM | POA: Diagnosis not present

## 2014-08-15 DIAGNOSIS — E291 Testicular hypofunction: Secondary | ICD-10-CM | POA: Diagnosis not present

## 2014-08-22 DIAGNOSIS — J208 Acute bronchitis due to other specified organisms: Secondary | ICD-10-CM | POA: Diagnosis not present

## 2014-08-22 DIAGNOSIS — Z6832 Body mass index (BMI) 32.0-32.9, adult: Secondary | ICD-10-CM | POA: Diagnosis not present

## 2014-08-22 DIAGNOSIS — M5136 Other intervertebral disc degeneration, lumbar region: Secondary | ICD-10-CM | POA: Diagnosis not present

## 2014-08-22 DIAGNOSIS — E1140 Type 2 diabetes mellitus with diabetic neuropathy, unspecified: Secondary | ICD-10-CM | POA: Diagnosis not present

## 2014-08-22 DIAGNOSIS — M5090 Cervical disc disorder, unspecified, unspecified cervical region: Secondary | ICD-10-CM | POA: Diagnosis not present

## 2014-08-22 DIAGNOSIS — I10 Essential (primary) hypertension: Secondary | ICD-10-CM | POA: Diagnosis not present

## 2014-08-22 DIAGNOSIS — G8929 Other chronic pain: Secondary | ICD-10-CM | POA: Diagnosis not present

## 2014-08-22 DIAGNOSIS — S2220XA Unspecified fracture of sternum, initial encounter for closed fracture: Secondary | ICD-10-CM | POA: Diagnosis not present

## 2014-08-22 DIAGNOSIS — F419 Anxiety disorder, unspecified: Secondary | ICD-10-CM | POA: Diagnosis not present

## 2014-08-22 DIAGNOSIS — E291 Testicular hypofunction: Secondary | ICD-10-CM | POA: Diagnosis not present

## 2014-08-23 DIAGNOSIS — R531 Weakness: Secondary | ICD-10-CM | POA: Diagnosis not present

## 2014-08-23 DIAGNOSIS — I4891 Unspecified atrial fibrillation: Secondary | ICD-10-CM | POA: Diagnosis not present

## 2014-08-23 DIAGNOSIS — Z79899 Other long term (current) drug therapy: Secondary | ICD-10-CM | POA: Diagnosis not present

## 2014-08-23 DIAGNOSIS — E11649 Type 2 diabetes mellitus with hypoglycemia without coma: Secondary | ICD-10-CM | POA: Diagnosis not present

## 2014-08-29 DIAGNOSIS — E291 Testicular hypofunction: Secondary | ICD-10-CM | POA: Diagnosis not present

## 2014-09-12 DIAGNOSIS — E291 Testicular hypofunction: Secondary | ICD-10-CM | POA: Diagnosis not present

## 2014-09-19 DIAGNOSIS — Z6832 Body mass index (BMI) 32.0-32.9, adult: Secondary | ICD-10-CM | POA: Diagnosis not present

## 2014-09-19 DIAGNOSIS — I2510 Atherosclerotic heart disease of native coronary artery without angina pectoris: Secondary | ICD-10-CM | POA: Diagnosis not present

## 2014-09-19 DIAGNOSIS — E291 Testicular hypofunction: Secondary | ICD-10-CM | POA: Diagnosis not present

## 2014-09-19 DIAGNOSIS — I10 Essential (primary) hypertension: Secondary | ICD-10-CM | POA: Diagnosis not present

## 2014-09-19 DIAGNOSIS — G8929 Other chronic pain: Secondary | ICD-10-CM | POA: Diagnosis not present

## 2014-09-19 DIAGNOSIS — M5090 Cervical disc disorder, unspecified, unspecified cervical region: Secondary | ICD-10-CM | POA: Diagnosis not present

## 2014-09-19 DIAGNOSIS — S2220XA Unspecified fracture of sternum, initial encounter for closed fracture: Secondary | ICD-10-CM | POA: Diagnosis not present

## 2014-09-19 DIAGNOSIS — M5136 Other intervertebral disc degeneration, lumbar region: Secondary | ICD-10-CM | POA: Diagnosis not present

## 2014-09-26 DIAGNOSIS — E291 Testicular hypofunction: Secondary | ICD-10-CM | POA: Diagnosis not present

## 2014-10-10 DIAGNOSIS — E291 Testicular hypofunction: Secondary | ICD-10-CM | POA: Diagnosis not present

## 2014-10-17 DIAGNOSIS — M5090 Cervical disc disorder, unspecified, unspecified cervical region: Secondary | ICD-10-CM | POA: Diagnosis not present

## 2014-10-17 DIAGNOSIS — E1140 Type 2 diabetes mellitus with diabetic neuropathy, unspecified: Secondary | ICD-10-CM | POA: Diagnosis not present

## 2014-10-17 DIAGNOSIS — I10 Essential (primary) hypertension: Secondary | ICD-10-CM | POA: Diagnosis not present

## 2014-10-17 DIAGNOSIS — E785 Hyperlipidemia, unspecified: Secondary | ICD-10-CM | POA: Diagnosis not present

## 2014-10-17 DIAGNOSIS — G8929 Other chronic pain: Secondary | ICD-10-CM | POA: Diagnosis not present

## 2014-10-17 DIAGNOSIS — Z760 Encounter for issue of repeat prescription: Secondary | ICD-10-CM | POA: Diagnosis not present

## 2014-10-17 DIAGNOSIS — Z79899 Other long term (current) drug therapy: Secondary | ICD-10-CM | POA: Diagnosis not present

## 2014-10-17 DIAGNOSIS — S2220XA Unspecified fracture of sternum, initial encounter for closed fracture: Secondary | ICD-10-CM | POA: Diagnosis not present

## 2014-10-17 DIAGNOSIS — M5136 Other intervertebral disc degeneration, lumbar region: Secondary | ICD-10-CM | POA: Diagnosis not present

## 2014-10-17 DIAGNOSIS — E1165 Type 2 diabetes mellitus with hyperglycemia: Secondary | ICD-10-CM | POA: Diagnosis not present

## 2014-10-17 DIAGNOSIS — E291 Testicular hypofunction: Secondary | ICD-10-CM | POA: Diagnosis not present

## 2014-10-17 DIAGNOSIS — Z6833 Body mass index (BMI) 33.0-33.9, adult: Secondary | ICD-10-CM | POA: Diagnosis not present

## 2014-10-31 DIAGNOSIS — E291 Testicular hypofunction: Secondary | ICD-10-CM | POA: Diagnosis not present

## 2014-11-14 DIAGNOSIS — E291 Testicular hypofunction: Secondary | ICD-10-CM | POA: Diagnosis not present

## 2014-11-14 DIAGNOSIS — S2220XA Unspecified fracture of sternum, initial encounter for closed fracture: Secondary | ICD-10-CM | POA: Diagnosis not present

## 2014-11-14 DIAGNOSIS — M5090 Cervical disc disorder, unspecified, unspecified cervical region: Secondary | ICD-10-CM | POA: Diagnosis not present

## 2014-11-14 DIAGNOSIS — G8929 Other chronic pain: Secondary | ICD-10-CM | POA: Diagnosis not present

## 2014-11-14 DIAGNOSIS — Z6833 Body mass index (BMI) 33.0-33.9, adult: Secondary | ICD-10-CM | POA: Diagnosis not present

## 2014-11-14 DIAGNOSIS — I10 Essential (primary) hypertension: Secondary | ICD-10-CM | POA: Diagnosis not present

## 2014-11-14 DIAGNOSIS — M5136 Other intervertebral disc degeneration, lumbar region: Secondary | ICD-10-CM | POA: Diagnosis not present

## 2014-11-28 DIAGNOSIS — E291 Testicular hypofunction: Secondary | ICD-10-CM | POA: Diagnosis not present

## 2014-12-12 DIAGNOSIS — I10 Essential (primary) hypertension: Secondary | ICD-10-CM | POA: Diagnosis not present

## 2014-12-12 DIAGNOSIS — G8929 Other chronic pain: Secondary | ICD-10-CM | POA: Diagnosis not present

## 2014-12-12 DIAGNOSIS — M5137 Other intervertebral disc degeneration, lumbosacral region: Secondary | ICD-10-CM | POA: Diagnosis not present

## 2014-12-12 DIAGNOSIS — E291 Testicular hypofunction: Secondary | ICD-10-CM | POA: Diagnosis not present

## 2014-12-12 DIAGNOSIS — M5030 Other cervical disc degeneration, unspecified cervical region: Secondary | ICD-10-CM | POA: Diagnosis not present

## 2014-12-12 DIAGNOSIS — Z6833 Body mass index (BMI) 33.0-33.9, adult: Secondary | ICD-10-CM | POA: Diagnosis not present

## 2014-12-12 DIAGNOSIS — S2220XA Unspecified fracture of sternum, initial encounter for closed fracture: Secondary | ICD-10-CM | POA: Diagnosis not present

## 2014-12-16 DIAGNOSIS — J029 Acute pharyngitis, unspecified: Secondary | ICD-10-CM | POA: Diagnosis not present

## 2014-12-16 DIAGNOSIS — R0789 Other chest pain: Secondary | ICD-10-CM | POA: Diagnosis not present

## 2014-12-16 DIAGNOSIS — R079 Chest pain, unspecified: Secondary | ICD-10-CM | POA: Diagnosis not present

## 2014-12-26 DIAGNOSIS — E291 Testicular hypofunction: Secondary | ICD-10-CM | POA: Diagnosis not present

## 2015-01-09 DIAGNOSIS — G8929 Other chronic pain: Secondary | ICD-10-CM | POA: Diagnosis not present

## 2015-01-09 DIAGNOSIS — M5030 Other cervical disc degeneration, unspecified cervical region: Secondary | ICD-10-CM | POA: Diagnosis not present

## 2015-01-09 DIAGNOSIS — Z23 Encounter for immunization: Secondary | ICD-10-CM | POA: Diagnosis not present

## 2015-01-09 DIAGNOSIS — I10 Essential (primary) hypertension: Secondary | ICD-10-CM | POA: Diagnosis not present

## 2015-01-09 DIAGNOSIS — M5137 Other intervertebral disc degeneration, lumbosacral region: Secondary | ICD-10-CM | POA: Diagnosis not present

## 2015-01-09 DIAGNOSIS — S2220XA Unspecified fracture of sternum, initial encounter for closed fracture: Secondary | ICD-10-CM | POA: Diagnosis not present

## 2015-01-09 DIAGNOSIS — L989 Disorder of the skin and subcutaneous tissue, unspecified: Secondary | ICD-10-CM | POA: Diagnosis not present

## 2015-01-09 DIAGNOSIS — Z6832 Body mass index (BMI) 32.0-32.9, adult: Secondary | ICD-10-CM | POA: Diagnosis not present

## 2015-01-09 DIAGNOSIS — E291 Testicular hypofunction: Secondary | ICD-10-CM | POA: Diagnosis not present

## 2015-01-18 DIAGNOSIS — L578 Other skin changes due to chronic exposure to nonionizing radiation: Secondary | ICD-10-CM | POA: Diagnosis not present

## 2015-01-18 DIAGNOSIS — L820 Inflamed seborrheic keratosis: Secondary | ICD-10-CM | POA: Diagnosis not present

## 2015-01-23 DIAGNOSIS — E291 Testicular hypofunction: Secondary | ICD-10-CM | POA: Diagnosis not present

## 2015-02-06 DIAGNOSIS — I10 Essential (primary) hypertension: Secondary | ICD-10-CM | POA: Diagnosis not present

## 2015-02-06 DIAGNOSIS — E785 Hyperlipidemia, unspecified: Secondary | ICD-10-CM | POA: Diagnosis not present

## 2015-02-06 DIAGNOSIS — H00015 Hordeolum externum left lower eyelid: Secondary | ICD-10-CM | POA: Diagnosis not present

## 2015-02-06 DIAGNOSIS — M5030 Other cervical disc degeneration, unspecified cervical region: Secondary | ICD-10-CM | POA: Diagnosis not present

## 2015-02-06 DIAGNOSIS — S2220XA Unspecified fracture of sternum, initial encounter for closed fracture: Secondary | ICD-10-CM | POA: Diagnosis not present

## 2015-02-06 DIAGNOSIS — Z125 Encounter for screening for malignant neoplasm of prostate: Secondary | ICD-10-CM | POA: Diagnosis not present

## 2015-02-06 DIAGNOSIS — G8929 Other chronic pain: Secondary | ICD-10-CM | POA: Diagnosis not present

## 2015-02-06 DIAGNOSIS — E663 Overweight: Secondary | ICD-10-CM | POA: Diagnosis not present

## 2015-02-06 DIAGNOSIS — Z79899 Other long term (current) drug therapy: Secondary | ICD-10-CM | POA: Diagnosis not present

## 2015-02-06 DIAGNOSIS — E1140 Type 2 diabetes mellitus with diabetic neuropathy, unspecified: Secondary | ICD-10-CM | POA: Diagnosis not present

## 2015-02-06 DIAGNOSIS — M5137 Other intervertebral disc degeneration, lumbosacral region: Secondary | ICD-10-CM | POA: Diagnosis not present

## 2015-02-06 DIAGNOSIS — E291 Testicular hypofunction: Secondary | ICD-10-CM | POA: Diagnosis not present

## 2015-02-20 DIAGNOSIS — E291 Testicular hypofunction: Secondary | ICD-10-CM | POA: Diagnosis not present

## 2015-03-06 DIAGNOSIS — G8929 Other chronic pain: Secondary | ICD-10-CM | POA: Diagnosis not present

## 2015-03-06 DIAGNOSIS — Z6834 Body mass index (BMI) 34.0-34.9, adult: Secondary | ICD-10-CM | POA: Diagnosis not present

## 2015-03-06 DIAGNOSIS — M5137 Other intervertebral disc degeneration, lumbosacral region: Secondary | ICD-10-CM | POA: Diagnosis not present

## 2015-03-06 DIAGNOSIS — E291 Testicular hypofunction: Secondary | ICD-10-CM | POA: Diagnosis not present

## 2015-03-06 DIAGNOSIS — E669 Obesity, unspecified: Secondary | ICD-10-CM | POA: Diagnosis not present

## 2015-03-06 DIAGNOSIS — S2220XA Unspecified fracture of sternum, initial encounter for closed fracture: Secondary | ICD-10-CM | POA: Diagnosis not present

## 2015-03-06 DIAGNOSIS — M5030 Other cervical disc degeneration, unspecified cervical region: Secondary | ICD-10-CM | POA: Diagnosis not present

## 2015-03-06 DIAGNOSIS — E1140 Type 2 diabetes mellitus with diabetic neuropathy, unspecified: Secondary | ICD-10-CM | POA: Diagnosis not present

## 2015-03-06 DIAGNOSIS — I10 Essential (primary) hypertension: Secondary | ICD-10-CM | POA: Diagnosis not present

## 2015-03-13 DIAGNOSIS — E669 Obesity, unspecified: Secondary | ICD-10-CM | POA: Diagnosis not present

## 2015-03-13 DIAGNOSIS — J208 Acute bronchitis due to other specified organisms: Secondary | ICD-10-CM | POA: Diagnosis not present

## 2015-03-20 DIAGNOSIS — E291 Testicular hypofunction: Secondary | ICD-10-CM | POA: Diagnosis not present

## 2015-04-03 DIAGNOSIS — E291 Testicular hypofunction: Secondary | ICD-10-CM | POA: Diagnosis not present

## 2015-04-03 DIAGNOSIS — L709 Acne, unspecified: Secondary | ICD-10-CM | POA: Diagnosis not present

## 2015-04-03 DIAGNOSIS — I10 Essential (primary) hypertension: Secondary | ICD-10-CM | POA: Diagnosis not present

## 2015-04-03 DIAGNOSIS — S2220XA Unspecified fracture of sternum, initial encounter for closed fracture: Secondary | ICD-10-CM | POA: Diagnosis not present

## 2015-04-03 DIAGNOSIS — M5030 Other cervical disc degeneration, unspecified cervical region: Secondary | ICD-10-CM | POA: Diagnosis not present

## 2015-04-03 DIAGNOSIS — Z6832 Body mass index (BMI) 32.0-32.9, adult: Secondary | ICD-10-CM | POA: Diagnosis not present

## 2015-04-03 DIAGNOSIS — M5137 Other intervertebral disc degeneration, lumbosacral region: Secondary | ICD-10-CM | POA: Diagnosis not present

## 2015-04-03 DIAGNOSIS — G8929 Other chronic pain: Secondary | ICD-10-CM | POA: Diagnosis not present

## 2015-04-17 DIAGNOSIS — E291 Testicular hypofunction: Secondary | ICD-10-CM | POA: Diagnosis not present

## 2015-05-01 DIAGNOSIS — E291 Testicular hypofunction: Secondary | ICD-10-CM | POA: Diagnosis not present

## 2015-05-01 DIAGNOSIS — I10 Essential (primary) hypertension: Secondary | ICD-10-CM | POA: Diagnosis not present

## 2015-05-01 DIAGNOSIS — Z6832 Body mass index (BMI) 32.0-32.9, adult: Secondary | ICD-10-CM | POA: Diagnosis not present

## 2015-05-01 DIAGNOSIS — M5137 Other intervertebral disc degeneration, lumbosacral region: Secondary | ICD-10-CM | POA: Diagnosis not present

## 2015-05-01 DIAGNOSIS — G8929 Other chronic pain: Secondary | ICD-10-CM | POA: Diagnosis not present

## 2015-05-01 DIAGNOSIS — E669 Obesity, unspecified: Secondary | ICD-10-CM | POA: Diagnosis not present

## 2015-05-01 DIAGNOSIS — Z1389 Encounter for screening for other disorder: Secondary | ICD-10-CM | POA: Diagnosis not present

## 2015-05-01 DIAGNOSIS — S2220XA Unspecified fracture of sternum, initial encounter for closed fracture: Secondary | ICD-10-CM | POA: Diagnosis not present

## 2015-05-01 DIAGNOSIS — M5030 Other cervical disc degeneration, unspecified cervical region: Secondary | ICD-10-CM | POA: Diagnosis not present

## 2015-05-01 DIAGNOSIS — E1140 Type 2 diabetes mellitus with diabetic neuropathy, unspecified: Secondary | ICD-10-CM | POA: Diagnosis not present

## 2015-05-12 DIAGNOSIS — E119 Type 2 diabetes mellitus without complications: Secondary | ICD-10-CM | POA: Diagnosis not present

## 2015-05-15 DIAGNOSIS — E291 Testicular hypofunction: Secondary | ICD-10-CM | POA: Diagnosis not present

## 2015-05-29 DIAGNOSIS — K5909 Other constipation: Secondary | ICD-10-CM | POA: Diagnosis not present

## 2015-05-29 DIAGNOSIS — G8929 Other chronic pain: Secondary | ICD-10-CM | POA: Diagnosis not present

## 2015-05-29 DIAGNOSIS — I10 Essential (primary) hypertension: Secondary | ICD-10-CM | POA: Diagnosis not present

## 2015-05-29 DIAGNOSIS — Z6833 Body mass index (BMI) 33.0-33.9, adult: Secondary | ICD-10-CM | POA: Diagnosis not present

## 2015-05-29 DIAGNOSIS — S2220XA Unspecified fracture of sternum, initial encounter for closed fracture: Secondary | ICD-10-CM | POA: Diagnosis not present

## 2015-05-29 DIAGNOSIS — Z79899 Other long term (current) drug therapy: Secondary | ICD-10-CM | POA: Diagnosis not present

## 2015-05-29 DIAGNOSIS — E1140 Type 2 diabetes mellitus with diabetic neuropathy, unspecified: Secondary | ICD-10-CM | POA: Diagnosis not present

## 2015-05-29 DIAGNOSIS — M5030 Other cervical disc degeneration, unspecified cervical region: Secondary | ICD-10-CM | POA: Diagnosis not present

## 2015-05-29 DIAGNOSIS — E291 Testicular hypofunction: Secondary | ICD-10-CM | POA: Diagnosis not present

## 2015-05-29 DIAGNOSIS — E785 Hyperlipidemia, unspecified: Secondary | ICD-10-CM | POA: Diagnosis not present

## 2015-05-29 DIAGNOSIS — E669 Obesity, unspecified: Secondary | ICD-10-CM | POA: Diagnosis not present

## 2015-05-29 DIAGNOSIS — M5137 Other intervertebral disc degeneration, lumbosacral region: Secondary | ICD-10-CM | POA: Diagnosis not present

## 2015-05-30 DIAGNOSIS — E291 Testicular hypofunction: Secondary | ICD-10-CM | POA: Diagnosis not present

## 2015-05-30 DIAGNOSIS — Z79899 Other long term (current) drug therapy: Secondary | ICD-10-CM | POA: Diagnosis not present

## 2015-05-30 DIAGNOSIS — E785 Hyperlipidemia, unspecified: Secondary | ICD-10-CM | POA: Diagnosis not present

## 2015-05-30 DIAGNOSIS — E1140 Type 2 diabetes mellitus with diabetic neuropathy, unspecified: Secondary | ICD-10-CM | POA: Diagnosis not present

## 2015-06-12 DIAGNOSIS — E291 Testicular hypofunction: Secondary | ICD-10-CM | POA: Diagnosis not present

## 2015-06-26 DIAGNOSIS — M5030 Other cervical disc degeneration, unspecified cervical region: Secondary | ICD-10-CM | POA: Diagnosis not present

## 2015-06-26 DIAGNOSIS — E291 Testicular hypofunction: Secondary | ICD-10-CM | POA: Diagnosis not present

## 2015-06-26 DIAGNOSIS — G8929 Other chronic pain: Secondary | ICD-10-CM | POA: Diagnosis not present

## 2015-06-26 DIAGNOSIS — S2220XA Unspecified fracture of sternum, initial encounter for closed fracture: Secondary | ICD-10-CM | POA: Diagnosis not present

## 2015-06-26 DIAGNOSIS — I10 Essential (primary) hypertension: Secondary | ICD-10-CM | POA: Diagnosis not present

## 2015-06-26 DIAGNOSIS — M5137 Other intervertebral disc degeneration, lumbosacral region: Secondary | ICD-10-CM | POA: Diagnosis not present

## 2015-06-26 DIAGNOSIS — Z6833 Body mass index (BMI) 33.0-33.9, adult: Secondary | ICD-10-CM | POA: Diagnosis not present

## 2015-07-10 DIAGNOSIS — E291 Testicular hypofunction: Secondary | ICD-10-CM | POA: Diagnosis not present

## 2015-07-23 DIAGNOSIS — E291 Testicular hypofunction: Secondary | ICD-10-CM | POA: Diagnosis not present

## 2015-07-23 DIAGNOSIS — M5030 Other cervical disc degeneration, unspecified cervical region: Secondary | ICD-10-CM | POA: Diagnosis not present

## 2015-07-23 DIAGNOSIS — I10 Essential (primary) hypertension: Secondary | ICD-10-CM | POA: Diagnosis not present

## 2015-07-23 DIAGNOSIS — M5137 Other intervertebral disc degeneration, lumbosacral region: Secondary | ICD-10-CM | POA: Diagnosis not present

## 2015-07-23 DIAGNOSIS — E669 Obesity, unspecified: Secondary | ICD-10-CM | POA: Diagnosis not present

## 2015-07-23 DIAGNOSIS — G8929 Other chronic pain: Secondary | ICD-10-CM | POA: Diagnosis not present

## 2015-07-23 DIAGNOSIS — S2220XA Unspecified fracture of sternum, initial encounter for closed fracture: Secondary | ICD-10-CM | POA: Diagnosis not present

## 2015-07-23 DIAGNOSIS — Z6833 Body mass index (BMI) 33.0-33.9, adult: Secondary | ICD-10-CM | POA: Diagnosis not present

## 2015-08-07 DIAGNOSIS — E291 Testicular hypofunction: Secondary | ICD-10-CM | POA: Diagnosis not present

## 2015-08-21 DIAGNOSIS — G8929 Other chronic pain: Secondary | ICD-10-CM | POA: Diagnosis not present

## 2015-08-21 DIAGNOSIS — Z6832 Body mass index (BMI) 32.0-32.9, adult: Secondary | ICD-10-CM | POA: Diagnosis not present

## 2015-08-21 DIAGNOSIS — I10 Essential (primary) hypertension: Secondary | ICD-10-CM | POA: Diagnosis not present

## 2015-08-21 DIAGNOSIS — M5137 Other intervertebral disc degeneration, lumbosacral region: Secondary | ICD-10-CM | POA: Diagnosis not present

## 2015-08-21 DIAGNOSIS — E291 Testicular hypofunction: Secondary | ICD-10-CM | POA: Diagnosis not present

## 2015-08-21 DIAGNOSIS — J449 Chronic obstructive pulmonary disease, unspecified: Secondary | ICD-10-CM | POA: Diagnosis not present

## 2015-08-21 DIAGNOSIS — S2220XA Unspecified fracture of sternum, initial encounter for closed fracture: Secondary | ICD-10-CM | POA: Diagnosis not present

## 2015-08-21 DIAGNOSIS — R110 Nausea: Secondary | ICD-10-CM | POA: Diagnosis not present

## 2015-08-21 DIAGNOSIS — M5030 Other cervical disc degeneration, unspecified cervical region: Secondary | ICD-10-CM | POA: Diagnosis not present

## 2015-09-04 DIAGNOSIS — E291 Testicular hypofunction: Secondary | ICD-10-CM | POA: Diagnosis not present

## 2015-09-14 DIAGNOSIS — M5137 Other intervertebral disc degeneration, lumbosacral region: Secondary | ICD-10-CM | POA: Diagnosis not present

## 2015-09-14 DIAGNOSIS — S2220XA Unspecified fracture of sternum, initial encounter for closed fracture: Secondary | ICD-10-CM | POA: Diagnosis not present

## 2015-09-14 DIAGNOSIS — E291 Testicular hypofunction: Secondary | ICD-10-CM | POA: Diagnosis not present

## 2015-09-14 DIAGNOSIS — M5030 Other cervical disc degeneration, unspecified cervical region: Secondary | ICD-10-CM | POA: Diagnosis not present

## 2015-09-14 DIAGNOSIS — I10 Essential (primary) hypertension: Secondary | ICD-10-CM | POA: Diagnosis not present

## 2015-09-14 DIAGNOSIS — Z6833 Body mass index (BMI) 33.0-33.9, adult: Secondary | ICD-10-CM | POA: Diagnosis not present

## 2015-09-14 DIAGNOSIS — G8929 Other chronic pain: Secondary | ICD-10-CM | POA: Diagnosis not present

## 2015-09-28 DIAGNOSIS — E291 Testicular hypofunction: Secondary | ICD-10-CM | POA: Diagnosis not present

## 2015-10-15 DIAGNOSIS — G8929 Other chronic pain: Secondary | ICD-10-CM | POA: Diagnosis not present

## 2015-10-15 DIAGNOSIS — Z6832 Body mass index (BMI) 32.0-32.9, adult: Secondary | ICD-10-CM | POA: Diagnosis not present

## 2015-10-15 DIAGNOSIS — E1140 Type 2 diabetes mellitus with diabetic neuropathy, unspecified: Secondary | ICD-10-CM | POA: Diagnosis not present

## 2015-10-15 DIAGNOSIS — E785 Hyperlipidemia, unspecified: Secondary | ICD-10-CM | POA: Diagnosis not present

## 2015-10-15 DIAGNOSIS — M5030 Other cervical disc degeneration, unspecified cervical region: Secondary | ICD-10-CM | POA: Diagnosis not present

## 2015-10-15 DIAGNOSIS — M5137 Other intervertebral disc degeneration, lumbosacral region: Secondary | ICD-10-CM | POA: Diagnosis not present

## 2015-10-15 DIAGNOSIS — N481 Balanitis: Secondary | ICD-10-CM | POA: Diagnosis not present

## 2015-10-15 DIAGNOSIS — E291 Testicular hypofunction: Secondary | ICD-10-CM | POA: Diagnosis not present

## 2015-10-15 DIAGNOSIS — I10 Essential (primary) hypertension: Secondary | ICD-10-CM | POA: Diagnosis not present

## 2015-10-15 DIAGNOSIS — Z79899 Other long term (current) drug therapy: Secondary | ICD-10-CM | POA: Diagnosis not present

## 2015-10-15 DIAGNOSIS — S2220XA Unspecified fracture of sternum, initial encounter for closed fracture: Secondary | ICD-10-CM | POA: Diagnosis not present

## 2015-10-30 DIAGNOSIS — E291 Testicular hypofunction: Secondary | ICD-10-CM | POA: Diagnosis not present

## 2015-11-12 DIAGNOSIS — G8929 Other chronic pain: Secondary | ICD-10-CM | POA: Diagnosis not present

## 2015-11-12 DIAGNOSIS — S2220XA Unspecified fracture of sternum, initial encounter for closed fracture: Secondary | ICD-10-CM | POA: Diagnosis not present

## 2015-11-12 DIAGNOSIS — Z6833 Body mass index (BMI) 33.0-33.9, adult: Secondary | ICD-10-CM | POA: Diagnosis not present

## 2015-11-12 DIAGNOSIS — M5137 Other intervertebral disc degeneration, lumbosacral region: Secondary | ICD-10-CM | POA: Diagnosis not present

## 2015-11-12 DIAGNOSIS — I10 Essential (primary) hypertension: Secondary | ICD-10-CM | POA: Diagnosis not present

## 2015-11-12 DIAGNOSIS — E291 Testicular hypofunction: Secondary | ICD-10-CM | POA: Diagnosis not present

## 2015-11-12 DIAGNOSIS — M5030 Other cervical disc degeneration, unspecified cervical region: Secondary | ICD-10-CM | POA: Diagnosis not present

## 2015-11-26 DIAGNOSIS — E291 Testicular hypofunction: Secondary | ICD-10-CM | POA: Diagnosis not present

## 2015-12-10 DIAGNOSIS — M5030 Other cervical disc degeneration, unspecified cervical region: Secondary | ICD-10-CM | POA: Diagnosis not present

## 2015-12-10 DIAGNOSIS — I10 Essential (primary) hypertension: Secondary | ICD-10-CM | POA: Diagnosis not present

## 2015-12-10 DIAGNOSIS — M5137 Other intervertebral disc degeneration, lumbosacral region: Secondary | ICD-10-CM | POA: Diagnosis not present

## 2015-12-10 DIAGNOSIS — E291 Testicular hypofunction: Secondary | ICD-10-CM | POA: Diagnosis not present

## 2015-12-10 DIAGNOSIS — Z6832 Body mass index (BMI) 32.0-32.9, adult: Secondary | ICD-10-CM | POA: Diagnosis not present

## 2015-12-10 DIAGNOSIS — G8929 Other chronic pain: Secondary | ICD-10-CM | POA: Diagnosis not present

## 2015-12-10 DIAGNOSIS — S2220XA Unspecified fracture of sternum, initial encounter for closed fracture: Secondary | ICD-10-CM | POA: Diagnosis not present

## 2015-12-24 DIAGNOSIS — E291 Testicular hypofunction: Secondary | ICD-10-CM | POA: Diagnosis not present

## 2016-01-07 DIAGNOSIS — Z6833 Body mass index (BMI) 33.0-33.9, adult: Secondary | ICD-10-CM | POA: Diagnosis not present

## 2016-01-07 DIAGNOSIS — S2220XA Unspecified fracture of sternum, initial encounter for closed fracture: Secondary | ICD-10-CM | POA: Diagnosis not present

## 2016-01-07 DIAGNOSIS — G8929 Other chronic pain: Secondary | ICD-10-CM | POA: Diagnosis not present

## 2016-01-07 DIAGNOSIS — E291 Testicular hypofunction: Secondary | ICD-10-CM | POA: Diagnosis not present

## 2016-01-07 DIAGNOSIS — Z23 Encounter for immunization: Secondary | ICD-10-CM | POA: Diagnosis not present

## 2016-01-07 DIAGNOSIS — M5030 Other cervical disc degeneration, unspecified cervical region: Secondary | ICD-10-CM | POA: Diagnosis not present

## 2016-01-07 DIAGNOSIS — M5137 Other intervertebral disc degeneration, lumbosacral region: Secondary | ICD-10-CM | POA: Diagnosis not present

## 2016-01-07 DIAGNOSIS — I10 Essential (primary) hypertension: Secondary | ICD-10-CM | POA: Diagnosis not present

## 2016-01-21 DIAGNOSIS — E291 Testicular hypofunction: Secondary | ICD-10-CM | POA: Diagnosis not present

## 2016-02-04 DIAGNOSIS — Z6834 Body mass index (BMI) 34.0-34.9, adult: Secondary | ICD-10-CM | POA: Diagnosis not present

## 2016-02-04 DIAGNOSIS — G8929 Other chronic pain: Secondary | ICD-10-CM | POA: Diagnosis not present

## 2016-02-04 DIAGNOSIS — E1140 Type 2 diabetes mellitus with diabetic neuropathy, unspecified: Secondary | ICD-10-CM | POA: Diagnosis not present

## 2016-02-04 DIAGNOSIS — E669 Obesity, unspecified: Secondary | ICD-10-CM | POA: Diagnosis not present

## 2016-02-04 DIAGNOSIS — M5137 Other intervertebral disc degeneration, lumbosacral region: Secondary | ICD-10-CM | POA: Diagnosis not present

## 2016-02-04 DIAGNOSIS — F419 Anxiety disorder, unspecified: Secondary | ICD-10-CM | POA: Diagnosis not present

## 2016-02-04 DIAGNOSIS — E291 Testicular hypofunction: Secondary | ICD-10-CM | POA: Diagnosis not present

## 2016-02-04 DIAGNOSIS — M5030 Other cervical disc degeneration, unspecified cervical region: Secondary | ICD-10-CM | POA: Diagnosis not present

## 2016-02-04 DIAGNOSIS — I10 Essential (primary) hypertension: Secondary | ICD-10-CM | POA: Diagnosis not present

## 2016-02-04 DIAGNOSIS — J449 Chronic obstructive pulmonary disease, unspecified: Secondary | ICD-10-CM | POA: Diagnosis not present

## 2016-02-04 DIAGNOSIS — E785 Hyperlipidemia, unspecified: Secondary | ICD-10-CM | POA: Diagnosis not present

## 2016-02-04 DIAGNOSIS — S2220XA Unspecified fracture of sternum, initial encounter for closed fracture: Secondary | ICD-10-CM | POA: Diagnosis not present

## 2016-02-05 DIAGNOSIS — Z79899 Other long term (current) drug therapy: Secondary | ICD-10-CM | POA: Diagnosis not present

## 2016-02-05 DIAGNOSIS — E785 Hyperlipidemia, unspecified: Secondary | ICD-10-CM | POA: Diagnosis not present

## 2016-02-05 DIAGNOSIS — E291 Testicular hypofunction: Secondary | ICD-10-CM | POA: Diagnosis not present

## 2016-02-05 DIAGNOSIS — E1140 Type 2 diabetes mellitus with diabetic neuropathy, unspecified: Secondary | ICD-10-CM | POA: Diagnosis not present

## 2016-02-05 DIAGNOSIS — Z125 Encounter for screening for malignant neoplasm of prostate: Secondary | ICD-10-CM | POA: Diagnosis not present

## 2016-02-19 DIAGNOSIS — E291 Testicular hypofunction: Secondary | ICD-10-CM | POA: Diagnosis not present

## 2016-03-03 DIAGNOSIS — E291 Testicular hypofunction: Secondary | ICD-10-CM | POA: Diagnosis not present

## 2016-03-03 DIAGNOSIS — I10 Essential (primary) hypertension: Secondary | ICD-10-CM | POA: Diagnosis not present

## 2016-03-03 DIAGNOSIS — M5030 Other cervical disc degeneration, unspecified cervical region: Secondary | ICD-10-CM | POA: Diagnosis not present

## 2016-03-03 DIAGNOSIS — M5137 Other intervertebral disc degeneration, lumbosacral region: Secondary | ICD-10-CM | POA: Diagnosis not present

## 2016-03-03 DIAGNOSIS — Z6833 Body mass index (BMI) 33.0-33.9, adult: Secondary | ICD-10-CM | POA: Diagnosis not present

## 2016-03-03 DIAGNOSIS — S2220XA Unspecified fracture of sternum, initial encounter for closed fracture: Secondary | ICD-10-CM | POA: Diagnosis not present

## 2016-03-03 DIAGNOSIS — G8929 Other chronic pain: Secondary | ICD-10-CM | POA: Diagnosis not present

## 2016-03-05 DIAGNOSIS — I119 Hypertensive heart disease without heart failure: Secondary | ICD-10-CM | POA: Diagnosis not present

## 2016-03-05 DIAGNOSIS — I25118 Atherosclerotic heart disease of native coronary artery with other forms of angina pectoris: Secondary | ICD-10-CM | POA: Diagnosis not present

## 2016-03-05 DIAGNOSIS — E669 Obesity, unspecified: Secondary | ICD-10-CM | POA: Diagnosis present

## 2016-03-05 DIAGNOSIS — E1151 Type 2 diabetes mellitus with diabetic peripheral angiopathy without gangrene: Secondary | ICD-10-CM | POA: Diagnosis not present

## 2016-03-05 DIAGNOSIS — I4891 Unspecified atrial fibrillation: Secondary | ICD-10-CM | POA: Diagnosis present

## 2016-03-05 DIAGNOSIS — Z6832 Body mass index (BMI) 32.0-32.9, adult: Secondary | ICD-10-CM | POA: Diagnosis not present

## 2016-03-05 DIAGNOSIS — R079 Chest pain, unspecified: Secondary | ICD-10-CM | POA: Diagnosis not present

## 2016-03-05 DIAGNOSIS — Z7982 Long term (current) use of aspirin: Secondary | ICD-10-CM | POA: Diagnosis not present

## 2016-03-05 DIAGNOSIS — E1142 Type 2 diabetes mellitus with diabetic polyneuropathy: Secondary | ICD-10-CM

## 2016-03-05 DIAGNOSIS — I214 Non-ST elevation (NSTEMI) myocardial infarction: Secondary | ICD-10-CM | POA: Diagnosis not present

## 2016-03-05 DIAGNOSIS — Z955 Presence of coronary angioplasty implant and graft: Secondary | ICD-10-CM | POA: Diagnosis not present

## 2016-03-05 DIAGNOSIS — Z8249 Family history of ischemic heart disease and other diseases of the circulatory system: Secondary | ICD-10-CM | POA: Diagnosis not present

## 2016-03-05 DIAGNOSIS — I10 Essential (primary) hypertension: Secondary | ICD-10-CM | POA: Diagnosis not present

## 2016-03-05 DIAGNOSIS — E785 Hyperlipidemia, unspecified: Secondary | ICD-10-CM

## 2016-03-05 DIAGNOSIS — G8929 Other chronic pain: Secondary | ICD-10-CM | POA: Diagnosis present

## 2016-03-05 DIAGNOSIS — K219 Gastro-esophageal reflux disease without esophagitis: Secondary | ICD-10-CM | POA: Diagnosis present

## 2016-03-05 DIAGNOSIS — E1165 Type 2 diabetes mellitus with hyperglycemia: Secondary | ICD-10-CM | POA: Diagnosis present

## 2016-03-05 DIAGNOSIS — I252 Old myocardial infarction: Secondary | ICD-10-CM | POA: Diagnosis not present

## 2016-03-05 DIAGNOSIS — M5124 Other intervertebral disc displacement, thoracic region: Secondary | ICD-10-CM | POA: Diagnosis present

## 2016-03-05 DIAGNOSIS — I5022 Chronic systolic (congestive) heart failure: Secondary | ICD-10-CM | POA: Diagnosis not present

## 2016-03-05 DIAGNOSIS — I2510 Atherosclerotic heart disease of native coronary artery without angina pectoris: Secondary | ICD-10-CM | POA: Diagnosis present

## 2016-03-05 DIAGNOSIS — F17211 Nicotine dependence, cigarettes, in remission: Secondary | ICD-10-CM | POA: Diagnosis not present

## 2016-03-05 DIAGNOSIS — Z794 Long term (current) use of insulin: Secondary | ICD-10-CM | POA: Diagnosis not present

## 2016-03-05 DIAGNOSIS — I110 Hypertensive heart disease with heart failure: Secondary | ICD-10-CM | POA: Diagnosis present

## 2016-03-05 DIAGNOSIS — Z87891 Personal history of nicotine dependence: Secondary | ICD-10-CM | POA: Diagnosis not present

## 2016-03-05 DIAGNOSIS — Z79891 Long term (current) use of opiate analgesic: Secondary | ICD-10-CM | POA: Diagnosis not present

## 2016-03-05 DIAGNOSIS — Z951 Presence of aortocoronary bypass graft: Secondary | ICD-10-CM | POA: Diagnosis not present

## 2016-03-05 DIAGNOSIS — Z79899 Other long term (current) drug therapy: Secondary | ICD-10-CM | POA: Diagnosis not present

## 2016-03-05 DIAGNOSIS — J449 Chronic obstructive pulmonary disease, unspecified: Secondary | ICD-10-CM | POA: Diagnosis not present

## 2016-03-06 DIAGNOSIS — E1151 Type 2 diabetes mellitus with diabetic peripheral angiopathy without gangrene: Secondary | ICD-10-CM | POA: Diagnosis not present

## 2016-03-06 DIAGNOSIS — K219 Gastro-esophageal reflux disease without esophagitis: Secondary | ICD-10-CM | POA: Diagnosis present

## 2016-03-06 DIAGNOSIS — I252 Old myocardial infarction: Secondary | ICD-10-CM | POA: Diagnosis not present

## 2016-03-06 DIAGNOSIS — M19019 Primary osteoarthritis, unspecified shoulder: Secondary | ICD-10-CM | POA: Diagnosis present

## 2016-03-06 DIAGNOSIS — I517 Cardiomegaly: Secondary | ICD-10-CM | POA: Diagnosis not present

## 2016-03-06 DIAGNOSIS — I501 Left ventricular failure: Secondary | ICD-10-CM | POA: Diagnosis not present

## 2016-03-06 DIAGNOSIS — E1165 Type 2 diabetes mellitus with hyperglycemia: Secondary | ICD-10-CM | POA: Diagnosis present

## 2016-03-06 DIAGNOSIS — R079 Chest pain, unspecified: Secondary | ICD-10-CM | POA: Diagnosis not present

## 2016-03-06 DIAGNOSIS — F329 Major depressive disorder, single episode, unspecified: Secondary | ICD-10-CM | POA: Diagnosis present

## 2016-03-06 DIAGNOSIS — J449 Chronic obstructive pulmonary disease, unspecified: Secondary | ICD-10-CM | POA: Diagnosis not present

## 2016-03-06 DIAGNOSIS — G894 Chronic pain syndrome: Secondary | ICD-10-CM | POA: Diagnosis present

## 2016-03-06 DIAGNOSIS — Z7982 Long term (current) use of aspirin: Secondary | ICD-10-CM | POA: Diagnosis not present

## 2016-03-06 DIAGNOSIS — I25118 Atherosclerotic heart disease of native coronary artery with other forms of angina pectoris: Secondary | ICD-10-CM | POA: Diagnosis not present

## 2016-03-06 DIAGNOSIS — I5022 Chronic systolic (congestive) heart failure: Secondary | ICD-10-CM | POA: Diagnosis present

## 2016-03-06 DIAGNOSIS — Z951 Presence of aortocoronary bypass graft: Secondary | ICD-10-CM | POA: Diagnosis not present

## 2016-03-06 DIAGNOSIS — I214 Non-ST elevation (NSTEMI) myocardial infarction: Secondary | ICD-10-CM | POA: Diagnosis not present

## 2016-03-06 DIAGNOSIS — R0789 Other chest pain: Secondary | ICD-10-CM | POA: Diagnosis not present

## 2016-03-06 DIAGNOSIS — I2510 Atherosclerotic heart disease of native coronary artery without angina pectoris: Secondary | ICD-10-CM | POA: Diagnosis present

## 2016-03-06 DIAGNOSIS — J45909 Unspecified asthma, uncomplicated: Secondary | ICD-10-CM | POA: Diagnosis present

## 2016-03-06 DIAGNOSIS — Z87891 Personal history of nicotine dependence: Secondary | ICD-10-CM | POA: Diagnosis not present

## 2016-03-06 DIAGNOSIS — I371 Nonrheumatic pulmonary valve insufficiency: Secondary | ICD-10-CM | POA: Diagnosis present

## 2016-03-06 DIAGNOSIS — I4891 Unspecified atrial fibrillation: Secondary | ICD-10-CM | POA: Diagnosis present

## 2016-03-06 DIAGNOSIS — I10 Essential (primary) hypertension: Secondary | ICD-10-CM | POA: Diagnosis not present

## 2016-03-06 DIAGNOSIS — E785 Hyperlipidemia, unspecified: Secondary | ICD-10-CM | POA: Diagnosis present

## 2016-03-06 DIAGNOSIS — I110 Hypertensive heart disease with heart failure: Secondary | ICD-10-CM | POA: Diagnosis present

## 2016-03-06 DIAGNOSIS — I119 Hypertensive heart disease without heart failure: Secondary | ICD-10-CM | POA: Diagnosis not present

## 2016-03-06 DIAGNOSIS — R9431 Abnormal electrocardiogram [ECG] [EKG]: Secondary | ICD-10-CM | POA: Diagnosis not present

## 2016-03-06 DIAGNOSIS — Z91040 Latex allergy status: Secondary | ICD-10-CM | POA: Diagnosis not present

## 2016-03-18 DIAGNOSIS — I214 Non-ST elevation (NSTEMI) myocardial infarction: Secondary | ICD-10-CM | POA: Diagnosis not present

## 2016-03-18 DIAGNOSIS — E785 Hyperlipidemia, unspecified: Secondary | ICD-10-CM | POA: Diagnosis not present

## 2016-03-18 DIAGNOSIS — I2510 Atherosclerotic heart disease of native coronary artery without angina pectoris: Secondary | ICD-10-CM | POA: Diagnosis not present

## 2016-03-18 DIAGNOSIS — Z6833 Body mass index (BMI) 33.0-33.9, adult: Secondary | ICD-10-CM | POA: Diagnosis not present

## 2016-03-18 DIAGNOSIS — Z79899 Other long term (current) drug therapy: Secondary | ICD-10-CM | POA: Diagnosis not present

## 2016-03-18 DIAGNOSIS — I249 Acute ischemic heart disease, unspecified: Secondary | ICD-10-CM | POA: Diagnosis not present

## 2016-03-18 DIAGNOSIS — E1140 Type 2 diabetes mellitus with diabetic neuropathy, unspecified: Secondary | ICD-10-CM | POA: Diagnosis not present

## 2016-03-18 DIAGNOSIS — I10 Essential (primary) hypertension: Secondary | ICD-10-CM | POA: Diagnosis not present

## 2016-03-18 DIAGNOSIS — E291 Testicular hypofunction: Secondary | ICD-10-CM | POA: Diagnosis not present

## 2016-03-31 DIAGNOSIS — Z6833 Body mass index (BMI) 33.0-33.9, adult: Secondary | ICD-10-CM | POA: Diagnosis not present

## 2016-03-31 DIAGNOSIS — E291 Testicular hypofunction: Secondary | ICD-10-CM | POA: Diagnosis not present

## 2016-03-31 DIAGNOSIS — I10 Essential (primary) hypertension: Secondary | ICD-10-CM | POA: Diagnosis not present

## 2016-03-31 DIAGNOSIS — S2220XA Unspecified fracture of sternum, initial encounter for closed fracture: Secondary | ICD-10-CM | POA: Diagnosis not present

## 2016-03-31 DIAGNOSIS — I2510 Atherosclerotic heart disease of native coronary artery without angina pectoris: Secondary | ICD-10-CM | POA: Diagnosis not present

## 2016-03-31 DIAGNOSIS — G8929 Other chronic pain: Secondary | ICD-10-CM | POA: Diagnosis not present

## 2016-03-31 DIAGNOSIS — M25511 Pain in right shoulder: Secondary | ICD-10-CM | POA: Diagnosis not present

## 2016-03-31 DIAGNOSIS — M5030 Other cervical disc degeneration, unspecified cervical region: Secondary | ICD-10-CM | POA: Diagnosis not present

## 2016-03-31 DIAGNOSIS — M5137 Other intervertebral disc degeneration, lumbosacral region: Secondary | ICD-10-CM | POA: Diagnosis not present

## 2016-04-07 DIAGNOSIS — E785 Hyperlipidemia, unspecified: Secondary | ICD-10-CM | POA: Insufficient documentation

## 2016-04-07 DIAGNOSIS — I2510 Atherosclerotic heart disease of native coronary artery without angina pectoris: Secondary | ICD-10-CM | POA: Insufficient documentation

## 2016-04-07 DIAGNOSIS — I255 Ischemic cardiomyopathy: Secondary | ICD-10-CM | POA: Insufficient documentation

## 2016-04-07 DIAGNOSIS — E784 Other hyperlipidemia: Secondary | ICD-10-CM | POA: Diagnosis not present

## 2016-04-07 DIAGNOSIS — I10 Essential (primary) hypertension: Secondary | ICD-10-CM | POA: Insufficient documentation

## 2016-04-07 DIAGNOSIS — Z951 Presence of aortocoronary bypass graft: Secondary | ICD-10-CM | POA: Insufficient documentation

## 2016-04-14 DIAGNOSIS — E291 Testicular hypofunction: Secondary | ICD-10-CM | POA: Diagnosis not present

## 2016-04-28 DIAGNOSIS — M25512 Pain in left shoulder: Secondary | ICD-10-CM | POA: Diagnosis not present

## 2016-04-28 DIAGNOSIS — M5030 Other cervical disc degeneration, unspecified cervical region: Secondary | ICD-10-CM | POA: Diagnosis not present

## 2016-04-28 DIAGNOSIS — I10 Essential (primary) hypertension: Secondary | ICD-10-CM | POA: Diagnosis not present

## 2016-04-28 DIAGNOSIS — M5137 Other intervertebral disc degeneration, lumbosacral region: Secondary | ICD-10-CM | POA: Diagnosis not present

## 2016-04-28 DIAGNOSIS — G8929 Other chronic pain: Secondary | ICD-10-CM | POA: Diagnosis not present

## 2016-04-28 DIAGNOSIS — Z6833 Body mass index (BMI) 33.0-33.9, adult: Secondary | ICD-10-CM | POA: Diagnosis not present

## 2016-04-28 DIAGNOSIS — E291 Testicular hypofunction: Secondary | ICD-10-CM | POA: Diagnosis not present

## 2016-04-28 DIAGNOSIS — I2510 Atherosclerotic heart disease of native coronary artery without angina pectoris: Secondary | ICD-10-CM | POA: Diagnosis not present

## 2016-04-28 DIAGNOSIS — S2220XA Unspecified fracture of sternum, initial encounter for closed fracture: Secondary | ICD-10-CM | POA: Diagnosis not present

## 2016-04-30 DIAGNOSIS — M25512 Pain in left shoulder: Secondary | ICD-10-CM | POA: Diagnosis not present

## 2016-05-12 DIAGNOSIS — E291 Testicular hypofunction: Secondary | ICD-10-CM | POA: Diagnosis not present

## 2016-05-26 DIAGNOSIS — M5137 Other intervertebral disc degeneration, lumbosacral region: Secondary | ICD-10-CM | POA: Diagnosis not present

## 2016-05-26 DIAGNOSIS — M25512 Pain in left shoulder: Secondary | ICD-10-CM | POA: Diagnosis not present

## 2016-05-26 DIAGNOSIS — S2220XA Unspecified fracture of sternum, initial encounter for closed fracture: Secondary | ICD-10-CM | POA: Diagnosis not present

## 2016-05-26 DIAGNOSIS — G8929 Other chronic pain: Secondary | ICD-10-CM | POA: Diagnosis not present

## 2016-05-26 DIAGNOSIS — I10 Essential (primary) hypertension: Secondary | ICD-10-CM | POA: Diagnosis not present

## 2016-05-26 DIAGNOSIS — J019 Acute sinusitis, unspecified: Secondary | ICD-10-CM | POA: Diagnosis not present

## 2016-05-26 DIAGNOSIS — M5030 Other cervical disc degeneration, unspecified cervical region: Secondary | ICD-10-CM | POA: Diagnosis not present

## 2016-05-26 DIAGNOSIS — I2510 Atherosclerotic heart disease of native coronary artery without angina pectoris: Secondary | ICD-10-CM | POA: Diagnosis not present

## 2016-05-26 DIAGNOSIS — Z79899 Other long term (current) drug therapy: Secondary | ICD-10-CM | POA: Diagnosis not present

## 2016-05-26 DIAGNOSIS — Z6833 Body mass index (BMI) 33.0-33.9, adult: Secondary | ICD-10-CM | POA: Diagnosis not present

## 2016-05-26 DIAGNOSIS — E785 Hyperlipidemia, unspecified: Secondary | ICD-10-CM | POA: Diagnosis not present

## 2016-05-26 DIAGNOSIS — E1140 Type 2 diabetes mellitus with diabetic neuropathy, unspecified: Secondary | ICD-10-CM | POA: Diagnosis not present

## 2016-05-28 DIAGNOSIS — Z79899 Other long term (current) drug therapy: Secondary | ICD-10-CM | POA: Diagnosis not present

## 2016-05-28 DIAGNOSIS — E1140 Type 2 diabetes mellitus with diabetic neuropathy, unspecified: Secondary | ICD-10-CM | POA: Diagnosis not present

## 2016-05-28 DIAGNOSIS — E291 Testicular hypofunction: Secondary | ICD-10-CM | POA: Diagnosis not present

## 2016-05-28 DIAGNOSIS — E785 Hyperlipidemia, unspecified: Secondary | ICD-10-CM | POA: Diagnosis not present

## 2016-06-05 DIAGNOSIS — I10 Essential (primary) hypertension: Secondary | ICD-10-CM | POA: Diagnosis not present

## 2016-06-05 DIAGNOSIS — E784 Other hyperlipidemia: Secondary | ICD-10-CM | POA: Diagnosis not present

## 2016-06-05 DIAGNOSIS — I255 Ischemic cardiomyopathy: Secondary | ICD-10-CM | POA: Diagnosis not present

## 2016-06-05 DIAGNOSIS — I2510 Atherosclerotic heart disease of native coronary artery without angina pectoris: Secondary | ICD-10-CM | POA: Diagnosis not present

## 2016-06-05 DIAGNOSIS — Z951 Presence of aortocoronary bypass graft: Secondary | ICD-10-CM | POA: Diagnosis not present

## 2016-06-09 DIAGNOSIS — E291 Testicular hypofunction: Secondary | ICD-10-CM | POA: Diagnosis not present

## 2016-06-23 DIAGNOSIS — Z6834 Body mass index (BMI) 34.0-34.9, adult: Secondary | ICD-10-CM | POA: Diagnosis not present

## 2016-06-23 DIAGNOSIS — M25512 Pain in left shoulder: Secondary | ICD-10-CM | POA: Diagnosis not present

## 2016-06-23 DIAGNOSIS — M5030 Other cervical disc degeneration, unspecified cervical region: Secondary | ICD-10-CM | POA: Diagnosis not present

## 2016-06-23 DIAGNOSIS — I10 Essential (primary) hypertension: Secondary | ICD-10-CM | POA: Diagnosis not present

## 2016-06-23 DIAGNOSIS — M5137 Other intervertebral disc degeneration, lumbosacral region: Secondary | ICD-10-CM | POA: Diagnosis not present

## 2016-06-23 DIAGNOSIS — S2220XA Unspecified fracture of sternum, initial encounter for closed fracture: Secondary | ICD-10-CM | POA: Diagnosis not present

## 2016-06-23 DIAGNOSIS — E291 Testicular hypofunction: Secondary | ICD-10-CM | POA: Diagnosis not present

## 2016-06-23 DIAGNOSIS — I2510 Atherosclerotic heart disease of native coronary artery without angina pectoris: Secondary | ICD-10-CM | POA: Diagnosis not present

## 2016-06-23 DIAGNOSIS — M25561 Pain in right knee: Secondary | ICD-10-CM | POA: Diagnosis not present

## 2016-06-23 DIAGNOSIS — G8929 Other chronic pain: Secondary | ICD-10-CM | POA: Diagnosis not present

## 2016-07-02 DIAGNOSIS — M7542 Impingement syndrome of left shoulder: Secondary | ICD-10-CM | POA: Diagnosis not present

## 2016-07-07 DIAGNOSIS — E291 Testicular hypofunction: Secondary | ICD-10-CM | POA: Diagnosis not present

## 2016-07-10 DIAGNOSIS — J449 Chronic obstructive pulmonary disease, unspecified: Secondary | ICD-10-CM | POA: Diagnosis not present

## 2016-07-10 DIAGNOSIS — J208 Acute bronchitis due to other specified organisms: Secondary | ICD-10-CM | POA: Diagnosis not present

## 2016-07-10 DIAGNOSIS — Z6833 Body mass index (BMI) 33.0-33.9, adult: Secondary | ICD-10-CM | POA: Diagnosis not present

## 2016-07-16 DIAGNOSIS — M25561 Pain in right knee: Secondary | ICD-10-CM | POA: Diagnosis not present

## 2016-07-21 DIAGNOSIS — M5030 Other cervical disc degeneration, unspecified cervical region: Secondary | ICD-10-CM | POA: Diagnosis not present

## 2016-07-21 DIAGNOSIS — I10 Essential (primary) hypertension: Secondary | ICD-10-CM | POA: Diagnosis not present

## 2016-07-21 DIAGNOSIS — J029 Acute pharyngitis, unspecified: Secondary | ICD-10-CM | POA: Diagnosis not present

## 2016-07-21 DIAGNOSIS — I2510 Atherosclerotic heart disease of native coronary artery without angina pectoris: Secondary | ICD-10-CM | POA: Diagnosis not present

## 2016-07-21 DIAGNOSIS — M5137 Other intervertebral disc degeneration, lumbosacral region: Secondary | ICD-10-CM | POA: Diagnosis not present

## 2016-07-21 DIAGNOSIS — Z6832 Body mass index (BMI) 32.0-32.9, adult: Secondary | ICD-10-CM | POA: Diagnosis not present

## 2016-07-21 DIAGNOSIS — G8929 Other chronic pain: Secondary | ICD-10-CM | POA: Diagnosis not present

## 2016-07-21 DIAGNOSIS — E291 Testicular hypofunction: Secondary | ICD-10-CM | POA: Diagnosis not present

## 2016-07-21 DIAGNOSIS — S2220XA Unspecified fracture of sternum, initial encounter for closed fracture: Secondary | ICD-10-CM | POA: Diagnosis not present

## 2016-08-04 DIAGNOSIS — Z6833 Body mass index (BMI) 33.0-33.9, adult: Secondary | ICD-10-CM | POA: Diagnosis not present

## 2016-08-04 DIAGNOSIS — Z9181 History of falling: Secondary | ICD-10-CM | POA: Diagnosis not present

## 2016-08-04 DIAGNOSIS — Z125 Encounter for screening for malignant neoplasm of prostate: Secondary | ICD-10-CM | POA: Diagnosis not present

## 2016-08-04 DIAGNOSIS — E291 Testicular hypofunction: Secondary | ICD-10-CM | POA: Diagnosis not present

## 2016-08-04 DIAGNOSIS — E785 Hyperlipidemia, unspecified: Secondary | ICD-10-CM | POA: Diagnosis not present

## 2016-08-04 DIAGNOSIS — Z136 Encounter for screening for cardiovascular disorders: Secondary | ICD-10-CM | POA: Diagnosis not present

## 2016-08-04 DIAGNOSIS — Z0000 Encounter for general adult medical examination without abnormal findings: Secondary | ICD-10-CM | POA: Diagnosis not present

## 2016-08-04 DIAGNOSIS — Z1389 Encounter for screening for other disorder: Secondary | ICD-10-CM | POA: Diagnosis not present

## 2016-08-18 DIAGNOSIS — I2510 Atherosclerotic heart disease of native coronary artery without angina pectoris: Secondary | ICD-10-CM | POA: Diagnosis not present

## 2016-08-18 DIAGNOSIS — M25512 Pain in left shoulder: Secondary | ICD-10-CM | POA: Diagnosis not present

## 2016-08-18 DIAGNOSIS — S2220XA Unspecified fracture of sternum, initial encounter for closed fracture: Secondary | ICD-10-CM | POA: Diagnosis not present

## 2016-08-18 DIAGNOSIS — M5030 Other cervical disc degeneration, unspecified cervical region: Secondary | ICD-10-CM | POA: Diagnosis not present

## 2016-08-18 DIAGNOSIS — M5137 Other intervertebral disc degeneration, lumbosacral region: Secondary | ICD-10-CM | POA: Diagnosis not present

## 2016-08-18 DIAGNOSIS — E291 Testicular hypofunction: Secondary | ICD-10-CM | POA: Diagnosis not present

## 2016-08-18 DIAGNOSIS — G8929 Other chronic pain: Secondary | ICD-10-CM | POA: Diagnosis not present

## 2016-08-18 DIAGNOSIS — I10 Essential (primary) hypertension: Secondary | ICD-10-CM | POA: Diagnosis not present

## 2016-08-18 DIAGNOSIS — Z6832 Body mass index (BMI) 32.0-32.9, adult: Secondary | ICD-10-CM | POA: Diagnosis not present

## 2016-08-24 DIAGNOSIS — J449 Chronic obstructive pulmonary disease, unspecified: Secondary | ICD-10-CM | POA: Diagnosis not present

## 2016-08-25 DIAGNOSIS — J440 Chronic obstructive pulmonary disease with acute lower respiratory infection: Secondary | ICD-10-CM | POA: Diagnosis not present

## 2016-08-25 DIAGNOSIS — R05 Cough: Secondary | ICD-10-CM | POA: Diagnosis not present

## 2016-08-25 DIAGNOSIS — R0602 Shortness of breath: Secondary | ICD-10-CM | POA: Diagnosis not present

## 2016-08-25 DIAGNOSIS — J189 Pneumonia, unspecified organism: Secondary | ICD-10-CM | POA: Diagnosis not present

## 2016-08-30 DIAGNOSIS — R0600 Dyspnea, unspecified: Secondary | ICD-10-CM | POA: Diagnosis not present

## 2016-08-30 DIAGNOSIS — R069 Unspecified abnormalities of breathing: Secondary | ICD-10-CM | POA: Diagnosis not present

## 2016-09-01 DIAGNOSIS — Z6832 Body mass index (BMI) 32.0-32.9, adult: Secondary | ICD-10-CM | POA: Diagnosis not present

## 2016-09-01 DIAGNOSIS — E291 Testicular hypofunction: Secondary | ICD-10-CM | POA: Diagnosis not present

## 2016-09-01 DIAGNOSIS — J180 Bronchopneumonia, unspecified organism: Secondary | ICD-10-CM | POA: Diagnosis not present

## 2016-09-01 DIAGNOSIS — F329 Major depressive disorder, single episode, unspecified: Secondary | ICD-10-CM | POA: Diagnosis not present

## 2016-09-04 DIAGNOSIS — J180 Bronchopneumonia, unspecified organism: Secondary | ICD-10-CM | POA: Diagnosis not present

## 2016-09-12 DIAGNOSIS — M7542 Impingement syndrome of left shoulder: Secondary | ICD-10-CM | POA: Diagnosis not present

## 2016-09-13 DIAGNOSIS — I10 Essential (primary) hypertension: Secondary | ICD-10-CM | POA: Diagnosis not present

## 2016-09-13 DIAGNOSIS — S2220XA Unspecified fracture of sternum, initial encounter for closed fracture: Secondary | ICD-10-CM | POA: Diagnosis not present

## 2016-09-13 DIAGNOSIS — G8929 Other chronic pain: Secondary | ICD-10-CM | POA: Diagnosis not present

## 2016-09-13 DIAGNOSIS — E1140 Type 2 diabetes mellitus with diabetic neuropathy, unspecified: Secondary | ICD-10-CM | POA: Diagnosis not present

## 2016-09-13 DIAGNOSIS — I2510 Atherosclerotic heart disease of native coronary artery without angina pectoris: Secondary | ICD-10-CM | POA: Diagnosis not present

## 2016-09-13 DIAGNOSIS — M5030 Other cervical disc degeneration, unspecified cervical region: Secondary | ICD-10-CM | POA: Diagnosis not present

## 2016-09-13 DIAGNOSIS — E785 Hyperlipidemia, unspecified: Secondary | ICD-10-CM | POA: Diagnosis not present

## 2016-09-13 DIAGNOSIS — M5137 Other intervertebral disc degeneration, lumbosacral region: Secondary | ICD-10-CM | POA: Diagnosis not present

## 2016-09-13 DIAGNOSIS — E291 Testicular hypofunction: Secondary | ICD-10-CM | POA: Diagnosis not present

## 2016-09-15 DIAGNOSIS — F321 Major depressive disorder, single episode, moderate: Secondary | ICD-10-CM | POA: Diagnosis not present

## 2016-09-18 ENCOUNTER — Other Ambulatory Visit: Payer: Self-pay | Admitting: Orthopedic Surgery

## 2016-09-18 DIAGNOSIS — M7542 Impingement syndrome of left shoulder: Secondary | ICD-10-CM

## 2016-09-29 DIAGNOSIS — L02219 Cutaneous abscess of trunk, unspecified: Secondary | ICD-10-CM | POA: Diagnosis not present

## 2016-09-29 DIAGNOSIS — Z6832 Body mass index (BMI) 32.0-32.9, adult: Secondary | ICD-10-CM | POA: Diagnosis not present

## 2016-09-29 DIAGNOSIS — E291 Testicular hypofunction: Secondary | ICD-10-CM | POA: Diagnosis not present

## 2016-09-29 DIAGNOSIS — I2510 Atherosclerotic heart disease of native coronary artery without angina pectoris: Secondary | ICD-10-CM | POA: Diagnosis not present

## 2016-09-29 DIAGNOSIS — Z955 Presence of coronary angioplasty implant and graft: Secondary | ICD-10-CM | POA: Diagnosis not present

## 2016-09-29 DIAGNOSIS — E785 Hyperlipidemia, unspecified: Secondary | ICD-10-CM | POA: Diagnosis not present

## 2016-09-29 DIAGNOSIS — I10 Essential (primary) hypertension: Secondary | ICD-10-CM | POA: Diagnosis not present

## 2016-09-29 DIAGNOSIS — I252 Old myocardial infarction: Secondary | ICD-10-CM | POA: Diagnosis not present

## 2016-09-29 DIAGNOSIS — L02213 Cutaneous abscess of chest wall: Secondary | ICD-10-CM | POA: Diagnosis not present

## 2016-09-29 DIAGNOSIS — L700 Acne vulgaris: Secondary | ICD-10-CM | POA: Diagnosis not present

## 2016-09-29 DIAGNOSIS — L0231 Cutaneous abscess of buttock: Secondary | ICD-10-CM | POA: Diagnosis not present

## 2016-09-29 DIAGNOSIS — Z951 Presence of aortocoronary bypass graft: Secondary | ICD-10-CM | POA: Diagnosis not present

## 2016-09-29 DIAGNOSIS — J019 Acute sinusitis, unspecified: Secondary | ICD-10-CM | POA: Diagnosis not present

## 2016-09-29 DIAGNOSIS — L02211 Cutaneous abscess of abdominal wall: Secondary | ICD-10-CM | POA: Diagnosis not present

## 2016-09-29 DIAGNOSIS — I255 Ischemic cardiomyopathy: Secondary | ICD-10-CM | POA: Diagnosis not present

## 2016-10-01 DIAGNOSIS — M7542 Impingement syndrome of left shoulder: Secondary | ICD-10-CM | POA: Diagnosis not present

## 2016-10-06 ENCOUNTER — Other Ambulatory Visit: Payer: Medicare Other

## 2016-10-06 DIAGNOSIS — L700 Acne vulgaris: Secondary | ICD-10-CM | POA: Diagnosis not present

## 2016-10-06 DIAGNOSIS — B356 Tinea cruris: Secondary | ICD-10-CM | POA: Diagnosis not present

## 2016-10-08 DIAGNOSIS — I255 Ischemic cardiomyopathy: Secondary | ICD-10-CM | POA: Diagnosis not present

## 2016-10-08 DIAGNOSIS — I252 Old myocardial infarction: Secondary | ICD-10-CM | POA: Diagnosis not present

## 2016-10-13 DIAGNOSIS — S2220XA Unspecified fracture of sternum, initial encounter for closed fracture: Secondary | ICD-10-CM | POA: Diagnosis not present

## 2016-10-13 DIAGNOSIS — Z6831 Body mass index (BMI) 31.0-31.9, adult: Secondary | ICD-10-CM | POA: Diagnosis not present

## 2016-10-13 DIAGNOSIS — M5137 Other intervertebral disc degeneration, lumbosacral region: Secondary | ICD-10-CM | POA: Diagnosis not present

## 2016-10-13 DIAGNOSIS — E785 Hyperlipidemia, unspecified: Secondary | ICD-10-CM | POA: Diagnosis not present

## 2016-10-13 DIAGNOSIS — I10 Essential (primary) hypertension: Secondary | ICD-10-CM | POA: Diagnosis not present

## 2016-10-13 DIAGNOSIS — E1140 Type 2 diabetes mellitus with diabetic neuropathy, unspecified: Secondary | ICD-10-CM | POA: Diagnosis not present

## 2016-10-13 DIAGNOSIS — E1165 Type 2 diabetes mellitus with hyperglycemia: Secondary | ICD-10-CM | POA: Diagnosis not present

## 2016-10-13 DIAGNOSIS — Z79899 Other long term (current) drug therapy: Secondary | ICD-10-CM | POA: Diagnosis not present

## 2016-10-13 DIAGNOSIS — I2510 Atherosclerotic heart disease of native coronary artery without angina pectoris: Secondary | ICD-10-CM | POA: Diagnosis not present

## 2016-10-13 DIAGNOSIS — E291 Testicular hypofunction: Secondary | ICD-10-CM | POA: Diagnosis not present

## 2016-10-13 DIAGNOSIS — M5030 Other cervical disc degeneration, unspecified cervical region: Secondary | ICD-10-CM | POA: Diagnosis not present

## 2016-10-13 DIAGNOSIS — G8929 Other chronic pain: Secondary | ICD-10-CM | POA: Diagnosis not present

## 2016-10-14 DIAGNOSIS — E1140 Type 2 diabetes mellitus with diabetic neuropathy, unspecified: Secondary | ICD-10-CM | POA: Diagnosis not present

## 2016-10-20 ENCOUNTER — Ambulatory Visit
Admission: RE | Admit: 2016-10-20 | Discharge: 2016-10-20 | Disposition: A | Discharge status: Home / Self Care | Payer: Medicare Other | Source: Ambulatory Visit | Attending: Orthopedic Surgery | Admitting: Orthopedic Surgery

## 2016-10-20 DIAGNOSIS — M7542 Impingement syndrome of left shoulder: Secondary | ICD-10-CM

## 2016-10-20 DIAGNOSIS — M75122 Complete rotator cuff tear or rupture of left shoulder, not specified as traumatic: Secondary | ICD-10-CM | POA: Diagnosis not present

## 2016-10-29 DIAGNOSIS — L02212 Cutaneous abscess of back [any part, except buttock]: Secondary | ICD-10-CM | POA: Diagnosis not present

## 2016-10-29 DIAGNOSIS — M75122 Complete rotator cuff tear or rupture of left shoulder, not specified as traumatic: Secondary | ICD-10-CM | POA: Diagnosis not present

## 2016-10-29 DIAGNOSIS — L700 Acne vulgaris: Secondary | ICD-10-CM | POA: Diagnosis not present

## 2016-10-29 DIAGNOSIS — M7542 Impingement syndrome of left shoulder: Secondary | ICD-10-CM | POA: Diagnosis not present

## 2016-11-10 DIAGNOSIS — E291 Testicular hypofunction: Secondary | ICD-10-CM | POA: Diagnosis not present

## 2016-11-10 DIAGNOSIS — I2510 Atherosclerotic heart disease of native coronary artery without angina pectoris: Secondary | ICD-10-CM | POA: Diagnosis not present

## 2016-11-10 DIAGNOSIS — I10 Essential (primary) hypertension: Secondary | ICD-10-CM | POA: Diagnosis not present

## 2016-11-10 DIAGNOSIS — M5137 Other intervertebral disc degeneration, lumbosacral region: Secondary | ICD-10-CM | POA: Diagnosis not present

## 2016-11-10 DIAGNOSIS — G8929 Other chronic pain: Secondary | ICD-10-CM | POA: Diagnosis not present

## 2016-11-10 DIAGNOSIS — S2220XA Unspecified fracture of sternum, initial encounter for closed fracture: Secondary | ICD-10-CM | POA: Diagnosis not present

## 2016-11-10 DIAGNOSIS — M5030 Other cervical disc degeneration, unspecified cervical region: Secondary | ICD-10-CM | POA: Diagnosis not present
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
## 2016-11-24 NOTE — Pre-Procedure Instructions (Signed)
Alejandro Taylor  11/24/2016      [facility] - [place], [place] - [place] [address] Phone: [phone] Fax: [phone]    Your procedure is scheduled on Thurs. Sept. 13  Report to [facility] Admitting at 10:40 A.M.  Call this number if you have problems the morning of surgery:  [phone]   Remember:  Do not eat food or drink liquids after midnight on Wed. Sept. 12   Take these medicines the morning of surgery with A SIP OF WATER : xanax if needed, duoneb if needed -bring to hospital, omeprazole (protonix),oxycodone if needed, oxycontin if needed, proair if needed-bring to hospital,tamsulosin (flomax)               Stop: advil, motrin, aleve, ibuprofen, BC Powders, Goody's, vitamin's and herbal medicines.              stop aspirin and brilinta per Dr. Onnie Graham     How to Manage Your Diabetes Before and After Surgery  Why is it important to control my blood sugar before and after surgery? . Improving blood sugar levels before and after surgery helps healing and can limit problems. . A way of improving blood sugar control is eating a healthy diet by: o  Eating less sugar and carbohydrates o  Increasing activity/exercise o  Talking with your doctor about reaching your blood sugar goals . High blood sugars (greater than 180 mg/dL) can raise your risk of infections and slow your recovery, so you will need to focus on controlling your diabetes during the weeks before surgery. . Make sure that the doctor who takes care of your diabetes knows about your planned surgery including the date and location.  How do I manage my blood sugar before surgery? . Check your blood sugar at least 4 times a day, starting 2 days before surgery, to make sure that the level is not too high or low. o Check your blood sugar the morning of your surgery when you wake up and every 2 hours until you get to the Short Stay unit. . If your blood sugar  is less than 70 mg/dL, you will need to treat for low blood sugar: o Do not take insulin. o Treat a low blood sugar (less than 70 mg/dL) with  cup of clear juice (cranberry or apple), 4 glucose tablets, OR glucose gel. o Recheck blood sugar in 15 minutes after treatment (to make sure it is greater than 70 mg/dL). If your blood sugar is not greater than 70 mg/dL on recheck, call [phone] for further instructions. . Report your blood sugar to the short stay nurse when you get to Short Stay.  . If you are admitted to the hospital after surgery: o Your blood sugar will be checked by the staff and you will probably be given insulin after surgery (instead of oral diabetes medicines) to make sure you have good blood sugar levels. o The goal for blood sugar control after surgery is 80-180 mg/dL.      WHAT DO I DO ABOUT MY DIABETES MEDICATION?   Marland Kitchen Do not take oral diabetes medicines (pills) the morning of surgery.   . THE NIGHT BEFORE SURGERY, take ____17_______ units of __LANTUS_________insulin.       Marland Kitchen HE MORNING OF SURGERY, take ______NONE_______ units of __________insulin.  . No humalog insulin the evening before surgery.      Do not wear jewelry.  Do not wear lotions,  powders, or perfumes, or deoderant.  Do not shave 48 hours prior to surgery.  Men may shave face and neck.  Do not bring valuables to the hospital.  [facility] is not responsible for any belongings or valuables.  Contacts, dentures or bridgework may not be worn into surgery.  Leave your suitcase in the car.  After surgery it may be brought to your room.  For patients admitted to the hospital, discharge time will be determined by your treatment team.  Patients discharged the day of surgery will not be allowed to drive home.   Name and phone number of your driver:  Special instructions:   [facility]- Preparing For Surgery  Before surgery, you can play an important role. Because skin is not sterile, your  skin needs to be as free of germs as possible. You can reduce the number of germs on your skin by washing with CHG (chlorahexidine gluconate) Soap before surgery.  CHG is an antiseptic cleaner which kills germs and bonds with the skin to continue killing germs even after washing.  Please do not use if you have an allergy to CHG or antibacterial soaps. If your skin becomes reddened/irritated stop using the CHG.  Do not shave (including legs and underarms) for at least 48 hours prior to first CHG shower. It is OK to shave your face.  Please follow these instructions carefully.   1. Shower the NIGHT BEFORE SURGERY and the MORNING OF SURGERY with CHG.   2. If you chose to wash your hair, wash your hair first as usual with your normal shampoo.  3. After you shampoo, rinse your hair and body thoroughly to remove the shampoo.  4. Use CHG as you would any other liquid soap. You can apply CHG directly to the skin and wash gently with a scrungie or a clean washcloth.   5. Apply the CHG Soap to your body ONLY FROM THE NECK DOWN.  Do not use on open wounds or open sores. Avoid contact with your eyes, ears, mouth and genitals (private parts). Wash genitals (private parts) with your normal soap.  6. Wash thoroughly, paying special attention to the area where your surgery will be performed.  7. Thoroughly rinse your body with warm water from the neck down.  8. DO NOT shower/wash with your normal soap after using and rinsing off the CHG Soap.  9. Pat yourself dry with a CLEAN TOWEL.   10. Wear CLEAN PAJAMAS   11. Place CLEAN SHEETS on your bed the night of your first shower and DO NOT SLEEP WITH PETS.    Day of Surgery: Do not apply any deodorants/lotions. Please wear clean clothes to the hospital/surgery center.      Please read over the following fact sheets that you were given. Coughing and Deep Breathing and Surgical Site Infection Prevention

## 2016-11-24 NOTE — Progress Notes (Signed)
Left message at Dr. Susie Cassette office for orders, none in epic.

## 2016-11-25 ENCOUNTER — Encounter (HOSPITAL_COMMUNITY)
Admission: RE | Admit: 2016-11-25 | Discharge: 2016-11-25 | Disposition: A | Discharge status: Home / Self Care | Payer: Medicare Other | Source: Ambulatory Visit | Attending: Orthopedic Surgery | Admitting: Orthopedic Surgery

## 2016-11-25 ENCOUNTER — Encounter (HOSPITAL_COMMUNITY): Payer: Self-pay

## 2016-11-25 DIAGNOSIS — I110 Hypertensive heart disease with heart failure: Secondary | ICD-10-CM | POA: Diagnosis not present

## 2016-11-25 DIAGNOSIS — S43432A Superior glenoid labrum lesion of left shoulder, initial encounter: Secondary | ICD-10-CM | POA: Diagnosis not present

## 2016-11-25 DIAGNOSIS — K589 Irritable bowel syndrome without diarrhea: Secondary | ICD-10-CM | POA: Diagnosis not present

## 2016-11-25 DIAGNOSIS — K219 Gastro-esophageal reflux disease without esophagitis: Secondary | ICD-10-CM | POA: Diagnosis not present

## 2016-11-25 DIAGNOSIS — F1120 Opioid dependence, uncomplicated: Secondary | ICD-10-CM | POA: Diagnosis not present

## 2016-11-25 DIAGNOSIS — I509 Heart failure, unspecified: Secondary | ICD-10-CM | POA: Diagnosis not present

## 2016-11-25 DIAGNOSIS — X58XXXA Exposure to other specified factors, initial encounter: Secondary | ICD-10-CM | POA: Diagnosis not present

## 2016-11-25 DIAGNOSIS — E119 Type 2 diabetes mellitus without complications: Secondary | ICD-10-CM | POA: Diagnosis not present

## 2016-11-25 DIAGNOSIS — M75122 Complete rotator cuff tear or rupture of left shoulder, not specified as traumatic: Secondary | ICD-10-CM | POA: Diagnosis not present

## 2016-11-25 DIAGNOSIS — Z7982 Long term (current) use of aspirin: Secondary | ICD-10-CM | POA: Diagnosis not present

## 2016-11-25 DIAGNOSIS — G8929 Other chronic pain: Secondary | ICD-10-CM | POA: Diagnosis not present

## 2016-11-25 DIAGNOSIS — I252 Old myocardial infarction: Secondary | ICD-10-CM | POA: Diagnosis not present

## 2016-11-25 DIAGNOSIS — I2510 Atherosclerotic heart disease of native coronary artery without angina pectoris: Secondary | ICD-10-CM | POA: Diagnosis not present

## 2016-11-25 DIAGNOSIS — M19012 Primary osteoarthritis, left shoulder: Secondary | ICD-10-CM | POA: Diagnosis not present

## 2016-11-25 DIAGNOSIS — Z87891 Personal history of nicotine dependence: Secondary | ICD-10-CM | POA: Diagnosis not present

## 2016-11-25 DIAGNOSIS — F329 Major depressive disorder, single episode, unspecified: Secondary | ICD-10-CM | POA: Diagnosis not present

## 2016-11-25 DIAGNOSIS — L700 Acne vulgaris: Secondary | ICD-10-CM | POA: Diagnosis not present

## 2016-11-25 DIAGNOSIS — Z9981 Dependence on supplemental oxygen: Secondary | ICD-10-CM | POA: Diagnosis not present

## 2016-11-25 DIAGNOSIS — E785 Hyperlipidemia, unspecified: Secondary | ICD-10-CM | POA: Diagnosis not present

## 2016-11-25 DIAGNOSIS — M7542 Impingement syndrome of left shoulder: Secondary | ICD-10-CM | POA: Diagnosis not present

## 2016-11-25 DIAGNOSIS — Z794 Long term (current) use of insulin: Secondary | ICD-10-CM | POA: Diagnosis not present

## 2016-11-25 DIAGNOSIS — Z955 Presence of coronary angioplasty implant and graft: Secondary | ICD-10-CM | POA: Diagnosis not present

## 2016-11-25 DIAGNOSIS — L02212 Cutaneous abscess of back [any part, except buttock]: Secondary | ICD-10-CM | POA: Diagnosis not present

## 2016-11-25 DIAGNOSIS — G4730 Sleep apnea, unspecified: Secondary | ICD-10-CM | POA: Diagnosis not present

## 2016-11-25 DIAGNOSIS — F410 Panic disorder [episodic paroxysmal anxiety] without agoraphobia: Secondary | ICD-10-CM | POA: Diagnosis not present

## 2016-11-25 DIAGNOSIS — F419 Anxiety disorder, unspecified: Secondary | ICD-10-CM | POA: Diagnosis not present

## 2016-11-25 DIAGNOSIS — I255 Ischemic cardiomyopathy: Secondary | ICD-10-CM | POA: Diagnosis not present

## 2016-11-25 HISTORY — DX: Gastro-esophageal reflux disease without esophagitis: K21.9

## 2016-11-25 HISTORY — DX: Acute myocardial infarction, unspecified: I21.9

## 2016-11-25 HISTORY — DX: Type 2 diabetes mellitus without complications: E11.9

## 2016-11-25 HISTORY — DX: Depression, unspecified: F32.A

## 2016-11-25 HISTORY — DX: Heart failure, unspecified: I50.9

## 2016-11-25 HISTORY — DX: Unspecified osteoarthritis, unspecified site: M19.90

## 2016-11-25 HISTORY — DX: Major depressive disorder, single episode, unspecified: F32.9

## 2016-11-25 HISTORY — DX: Dependence on supplemental oxygen: Z99.81

## 2016-11-25 HISTORY — DX: Essential (primary) hypertension: I10

## 2016-11-25 HISTORY — DX: Unspecified injury of muscle(s) and tendon(s) of the rotator cuff of left shoulder, initial encounter: S46.002A

## 2016-11-25 HISTORY — DX: Pneumonia, unspecified organism: J18.9

## 2016-11-25 HISTORY — DX: Anxiety disorder, unspecified: F41.9

## 2016-11-25 HISTORY — DX: Sleep apnea, unspecified: G47.30

## 2016-11-25 HISTORY — DX: Angina pectoris, unspecified: I20.9

## 2016-11-25 LAB — CBC
HEMATOCRIT: 42.6 % (ref 39.0–52.0)
Hemoglobin: 13.8 g/dL (ref 13.0–17.0)
MCH: 27.7 pg (ref 26.0–34.0)
MCHC: 32.4 g/dL (ref 30.0–36.0)
MCV: 85.5 fL (ref 78.0–100.0)
Platelets: 172 10*3/uL (ref 150–400)
RBC: 4.98 MIL/uL (ref 4.22–5.81)
RDW: 13.7 % (ref 11.5–15.5)
WBC: 8.8 10*3/uL (ref 4.0–10.5)

## 2016-11-25 LAB — BASIC METABOLIC PANEL
Anion gap: 6 (ref 5–15)
BUN: 9 mg/dL (ref 6–20)
CHLORIDE: 103 mmol/L (ref 101–111)
CO2: 26 mmol/L (ref 22–32)
CREATININE: 1.19 mg/dL (ref 0.61–1.24)
Calcium: 9.4 mg/dL (ref 8.9–10.3)
GFR calc Af Amer: >60 mL/min (ref >60)
GFR calc non Af Amer: >60 mL/min (ref >60)
GLUCOSE: 195 mg/dL — AB (ref 65–99)
POTASSIUM: 4.2 mmol/L (ref 3.5–5.1)
Sodium: 135 mmol/L (ref 135–145)

## 2016-11-25 LAB — HEMOGLOBIN A1C
Hgb A1c MFr Bld: 8.3 % — ABNORMAL HIGH (ref 4.8–5.6)
Mean Plasma Glucose: 191.51 mg/dL

## 2016-11-25 LAB — SURGICAL PCR SCREEN
MRSA, PCR: NEGATIVE
STAPHYLOCOCCUS AUREUS: NEGATIVE

## 2016-11-25 LAB — GLUCOSE, CAPILLARY: GLUCOSE-CAPILLARY: 254 mg/dL — AB (ref 65–99)

## 2016-11-25 NOTE — Progress Notes (Signed)
Alejandro Taylor            11/24/2016                          [facility] - [place], [place] - [place] [address] Phone: [phone] Fax: [phone]              Your procedure is scheduled on Thurs. Sept. 13            Report to [facility] Admitting at 10:40 A.M.            Call this number if you have problems the morning of surgery:            [phone]             Remember:            Do not eat food or drink liquids after midnight on Wed. Sept. 12             Take these medicines the morning of surgery with A SIP OF WATER : xanax if needed, duoneb if needed -bring to hospital, omeprazole (protonix),oxycodone if needed, oxycontin if needed, proair if needed-bring to hospital,tamsulosin (flomax)               Stop: advil, motrin, aleve, ibuprofen, BC Powders, Goody's, vitamin's and herbal medicines.              stop aspirin and brilinta per Dr. Onnie Graham  HOW TO [facility] YOUR DIABETES BEFORE AND AFTER SURGERY  Why is it important to control my blood sugar before and after surgery?  Improving blood sugar levels before and after surgery helps healing and can limit problems.  A way of improving blood sugar control is eating a healthy diet by: ?  Eating less sugar and carbohydrates ?  Increasing activity/exercise ?  Talking with your doctor about reaching your blood sugar goals  High blood sugars (greater than 180 mg/dL) can raise your risk of infections and slow your recovery, so you will need to focus on controlling your diabetes during the weeks before surgery.  Make sure that the doctor who takes care of your diabetes knows about your planned surgery including the date and location.  How do I manage my blood sugar before surgery?  Check your blood sugar at least 4 times a day, starting 2 days before surgery, to make sure that the level is not too high or low. ? Check your blood sugar the morning of  your surgery when you wake up and every 2 hours until you get to the Short Stay unit.  If your blood sugar is less than 70 mg/dL, you will need to treat for low blood sugar: ? Do not take insulin. ? Treat a low blood sugar (less than 70 mg/dL) with  cup of clear juice (cranberry or apple), 4glucose tablets, OR glucose gel. ? Recheck blood sugar in 15 minutes after treatment (to make sure it is greater than 70 mg/dL). If your blood sugar is not greater than 70 mg/dL on recheck, call [phone] for further instructions.  Report your blood sugar to the short stay nurse when you get to Short Stay.   If you are admitted to the hospital after surgery: ? Your blood sugar will be checked by the staff and you will probably be given insulin after surgery (instead of oral diabetes medicines) to make sure you have  good blood sugar levels. ? The goal for blood sugar control after surgery is 80-180 mg/dL.  WHAT DO I DO ABOUT MY DIABETES MEDICATION    Do not take oral diabetes medicines (pills) the morning of surgery.   THE NIGHT BEFORE SURGERY, take       2-3          Units of        HUMALOG             Insulin.    THE NIGHT BEFORE SURGERY, take ____17_______ units of __LANTUS_________insulin.                                              THE MORNING OF SURGERY, take ______NONE_______ units of __________insulin.             Do not wear jewelry.            Do not wear lotions, powders, or perfumes, or deoderant.            Do not shave 48 hours prior to surgery.  Men may shave face and neck.            Do not bring valuables to the hospital.            [facility] is not responsible for any belongings or valuables.  Contacts, dentures or bridgework may not be worn into surgery.  Leave your suitcase in the car.  After surgery it may be brought to your room.  For patients admitted to the hospital, discharge time will be determined by your treatment team.  Patients discharged  the day of surgery will not be allowed to drive home.   Name and phone number of your driver:  Special instructions:   [facility]- Preparing For Surgery  Before surgery, you can play an important role. Because skin is not sterile, your skin needs to be as free of germs as possible. You can reduce the number of germs on your skin by washing with CHG (chlorahexidine gluconate) Soap before surgery.  CHG is an antiseptic cleaner which kills germs and bonds with the skin to continue killing germs even after washing.  Please do not use if you have an allergy to CHG or antibacterial soaps. If your skin becomes reddened/irritated stop using the CHG.  Do not shave (including legs and underarms) for at least 48 hours prior to first CHG shower. It is OK to shave your face.  Please follow these instructions carefully.                                                                                                                     1. Shower the NIGHT BEFORE SURGERY and the MORNING OF SURGERY with CHG.   2. If you chose to wash your hair, wash your hair first as usual with your normal shampoo.  3. After you shampoo, rinse your hair and body thoroughly to remove the shampoo.  4. Use CHG as you would any other liquid soap. You can apply CHG directly to the skin and wash gently with a scrungie or a clean washcloth.   5. Apply the CHG Soap to your body ONLY FROM THE NECK DOWN.  Do not use on open wounds or open sores. Avoid contact with your eyes, ears, mouth and genitals (private parts). Wash genitals (private parts) with your normal soap.  6. Wash thoroughly, paying special attention to the area where your surgery will be performed.  7. Thoroughly rinse your body with warm water from the neck down.  8. DO NOT shower/wash with your normal soap after using and rinsing off the CHG Soap.  9. Pat yourself dry with a CLEAN TOWEL.   10. Wear CLEAN PAJAMAS   11. Place CLEAN SHEETS on your  bed the night of your first shower and DO NOT SLEEP WITH PETS.  Day of Surgery: Do not apply any deodorants/lotions. Please wear clean clothes to the hospital/surgery center.    Please read over the following fact sheets that you were given. Coughing and Deep Breathing and Surgical Site Infection Prevention

## 2016-11-25 NOTE — Progress Notes (Signed)
[facility]  Cardiologist - Dr. Shelle Iron  Chest x-ray -  Denies  EKG - 11/25/16  Stress Test -   ECHO - 10/08/16 (in paper chart)  Cardiac Cath - 03/06/16 ([facility])  Sleep Study - Yes CPAP - None  Fasting Blood Sugar - 130, Today 254 Checks Blood Sugar __2___ times a day  Pt sts he has stopped taking Brilinta 45mg  PO BID since 11/03/16, and he will resume after the surgery. Pt was also told to continue taking ASA 81mg  daily.  Chart will be given to anesthesia for review due to cardiac history. All requested cardiac records are in the paper chart.  Pt denies having chest pain, sob, or fever at this time. All instructions explained to the pt, with a verbal understanding of the material. Pt agrees to go over the instructions while at home for a better understanding. The opportunity to ask questions was provided.

## 2016-11-27 ENCOUNTER — Ambulatory Visit (HOSPITAL_COMMUNITY): Payer: Medicare Other | Admitting: Certified Registered Nurse Anesthetist

## 2016-11-27 ENCOUNTER — Ambulatory Visit (HOSPITAL_COMMUNITY): Payer: Medicare Other | Admitting: Vascular Surgery

## 2016-11-27 ENCOUNTER — Encounter (HOSPITAL_COMMUNITY)
Admission: RE | Disposition: A | Discharge status: Home / Self Care | Payer: Self-pay | Source: Ambulatory Visit | Attending: Orthopedic Surgery

## 2016-11-27 ENCOUNTER — Encounter (HOSPITAL_COMMUNITY): Payer: Self-pay

## 2016-11-27 ENCOUNTER — Ambulatory Visit (HOSPITAL_COMMUNITY)
Admission: RE | Admit: 2016-11-27 | Discharge: 2016-11-27 | Disposition: A | Discharge status: Home / Self Care | Payer: Medicare Other | Source: Ambulatory Visit | Attending: Orthopedic Surgery | Admitting: Orthopedic Surgery

## 2016-11-27 DIAGNOSIS — F419 Anxiety disorder, unspecified: Secondary | ICD-10-CM | POA: Insufficient documentation

## 2016-11-27 DIAGNOSIS — E119 Type 2 diabetes mellitus without complications: Secondary | ICD-10-CM | POA: Diagnosis not present

## 2016-11-27 DIAGNOSIS — G8918 Other acute postprocedural pain: Secondary | ICD-10-CM | POA: Diagnosis not present

## 2016-11-27 DIAGNOSIS — I110 Hypertensive heart disease with heart failure: Secondary | ICD-10-CM | POA: Insufficient documentation

## 2016-11-27 DIAGNOSIS — Z9981 Dependence on supplemental oxygen: Secondary | ICD-10-CM | POA: Insufficient documentation

## 2016-11-27 DIAGNOSIS — I509 Heart failure, unspecified: Secondary | ICD-10-CM | POA: Insufficient documentation

## 2016-11-27 DIAGNOSIS — Z87891 Personal history of nicotine dependence: Secondary | ICD-10-CM | POA: Insufficient documentation

## 2016-11-27 DIAGNOSIS — M75122 Complete rotator cuff tear or rupture of left shoulder, not specified as traumatic: Secondary | ICD-10-CM | POA: Diagnosis not present

## 2016-11-27 DIAGNOSIS — G4730 Sleep apnea, unspecified: Secondary | ICD-10-CM | POA: Insufficient documentation

## 2016-11-27 DIAGNOSIS — I252 Old myocardial infarction: Secondary | ICD-10-CM | POA: Diagnosis not present

## 2016-11-27 DIAGNOSIS — F329 Major depressive disorder, single episode, unspecified: Secondary | ICD-10-CM | POA: Insufficient documentation

## 2016-11-27 DIAGNOSIS — M7542 Impingement syndrome of left shoulder: Secondary | ICD-10-CM | POA: Insufficient documentation

## 2016-11-27 DIAGNOSIS — I2510 Atherosclerotic heart disease of native coronary artery without angina pectoris: Secondary | ICD-10-CM | POA: Insufficient documentation

## 2016-11-27 DIAGNOSIS — Z955 Presence of coronary angioplasty implant and graft: Secondary | ICD-10-CM | POA: Insufficient documentation

## 2016-11-27 DIAGNOSIS — Z7982 Long term (current) use of aspirin: Secondary | ICD-10-CM | POA: Insufficient documentation

## 2016-11-27 DIAGNOSIS — M75102 Unspecified rotator cuff tear or rupture of left shoulder, not specified as traumatic: Secondary | ICD-10-CM | POA: Diagnosis not present

## 2016-11-27 DIAGNOSIS — K219 Gastro-esophageal reflux disease without esophagitis: Secondary | ICD-10-CM | POA: Insufficient documentation

## 2016-11-27 DIAGNOSIS — S43432A Superior glenoid labrum lesion of left shoulder, initial encounter: Secondary | ICD-10-CM | POA: Insufficient documentation

## 2016-11-27 DIAGNOSIS — Z888 Allergy status to other drugs, medicaments and biological substances status: Secondary | ICD-10-CM | POA: Insufficient documentation

## 2016-11-27 DIAGNOSIS — M19012 Primary osteoarthritis, left shoulder: Secondary | ICD-10-CM | POA: Insufficient documentation

## 2016-11-27 DIAGNOSIS — Z91040 Latex allergy status: Secondary | ICD-10-CM | POA: Insufficient documentation

## 2016-11-27 DIAGNOSIS — I255 Ischemic cardiomyopathy: Secondary | ICD-10-CM | POA: Insufficient documentation

## 2016-11-27 DIAGNOSIS — X58XXXA Exposure to other specified factors, initial encounter: Secondary | ICD-10-CM | POA: Insufficient documentation

## 2016-11-27 DIAGNOSIS — S46092A Other injury of muscle(s) and tendon(s) of the rotator cuff of left shoulder, initial encounter: Secondary | ICD-10-CM | POA: Diagnosis not present

## 2016-11-27 DIAGNOSIS — F1120 Opioid dependence, uncomplicated: Secondary | ICD-10-CM | POA: Insufficient documentation

## 2016-11-27 DIAGNOSIS — E785 Hyperlipidemia, unspecified: Secondary | ICD-10-CM | POA: Insufficient documentation

## 2016-11-27 DIAGNOSIS — G8929 Other chronic pain: Secondary | ICD-10-CM | POA: Insufficient documentation

## 2016-11-27 DIAGNOSIS — Z794 Long term (current) use of insulin: Secondary | ICD-10-CM | POA: Insufficient documentation

## 2016-11-27 DIAGNOSIS — Z79899 Other long term (current) drug therapy: Secondary | ICD-10-CM | POA: Insufficient documentation

## 2016-11-27 DIAGNOSIS — K589 Irritable bowel syndrome without diarrhea: Secondary | ICD-10-CM | POA: Insufficient documentation

## 2016-11-27 DIAGNOSIS — F410 Panic disorder [episodic paroxysmal anxiety] without agoraphobia: Secondary | ICD-10-CM | POA: Insufficient documentation

## 2016-11-27 HISTORY — PX: SHOULDER ARTHROSCOPY WITH ROTATOR CUFF REPAIR AND SUBACROMIAL DECOMPRESSION: SHX5686

## 2016-11-27 LAB — GLUCOSE, CAPILLARY
GLUCOSE-CAPILLARY: 148 mg/dL — AB (ref 65–99)
GLUCOSE-CAPILLARY: 107 mg/dL — AB (ref 65–99)

## 2016-11-27 SURGERY — SHOULDER ARTHROSCOPY WITH ROTATOR CUFF REPAIR AND SUBACROMIAL DECOMPRESSION
Anesthesia: General | Laterality: Left

## 2016-11-27 MED ORDER — ONDANSETRON HCL 4 MG PO TABS: 4.0000 mg | ORAL_TABLET | Freq: Three times a day (TID) | ORAL | 0 refills | Status: DC | PRN

## 2016-11-27 MED ORDER — LACTATED RINGERS IV SOLN: INTRAVENOUS | Status: DC

## 2016-11-27 MED ORDER — PROPOFOL 10 MG/ML IV BOLUS
INTRAVENOUS | Status: DC | PRN
  Administered 2016-11-27: 10 mg via INTRAVENOUS
  Administered 2016-11-27: 150 mg via INTRAVENOUS

## 2016-11-27 MED ORDER — ROPIVACAINE HCL 5 MG/ML IJ SOLN
INTRAMUSCULAR | Status: DC | PRN
  Administered 2016-11-27: 30 mL via PERINEURAL

## 2016-11-27 MED ORDER — HYDROMORPHONE HCL 1 MG/ML IJ SOLN
INTRAMUSCULAR | Status: DC | PRN
  Administered 2016-11-27: 0.5 mg via INTRAVENOUS

## 2016-11-27 MED ORDER — SODIUM CHLORIDE 0.9 % IR SOLN
Status: DC | PRN
  Administered 2016-11-27: 3000 mL

## 2016-11-27 MED ORDER — METHOCARBAMOL 500 MG PO TABS: 500.0000 mg | ORAL_TABLET | Freq: Three times a day (TID) | ORAL | 1 refills | Status: DC | PRN

## 2016-11-27 MED ORDER — HYDROMORPHONE HCL 1 MG/ML IJ SOLN: 0.2500 mg | INTRAMUSCULAR | Status: DC | PRN

## 2016-11-27 MED ORDER — FENTANYL CITRATE (PF) 100 MCG/2ML IJ SOLN
INTRAMUSCULAR | Status: DC | PRN
  Administered 2016-11-27: 150 ug via INTRAVENOUS
  Administered 2016-11-27: 100 ug via INTRAVENOUS

## 2016-11-27 MED ORDER — CHLORHEXIDINE GLUCONATE 4 % EX LIQD: 60.0000 mL | Freq: Once | CUTANEOUS | Status: DC

## 2016-11-27 MED ORDER — DEXTROSE 5 % IV SOLN
INTRAVENOUS | Status: DC | PRN
  Administered 2016-11-27: 50 ug/min via INTRAVENOUS

## 2016-11-27 MED ORDER — MIDAZOLAM HCL 2 MG/2ML IJ SOLN
2.0000 mg | Freq: Once | INTRAMUSCULAR | Status: AC
  Administered 2016-11-27: 2 mg via INTRAVENOUS
  Filled 2016-11-27: qty 2

## 2016-11-27 MED ORDER — MIDAZOLAM HCL 2 MG/2ML IJ SOLN
INTRAMUSCULAR | Status: AC
  Filled 2016-11-27: qty 2

## 2016-11-27 MED ORDER — CALCIUM CHLORIDE 10 % IV SOLN
INTRAVENOUS | Status: AC
  Filled 2016-11-27: qty 10

## 2016-11-27 MED ORDER — DEXAMETHASONE SODIUM PHOSPHATE 10 MG/ML IJ SOLN
INTRAMUSCULAR | Status: DC | PRN
  Administered 2016-11-27: 10 mg via INTRAVENOUS

## 2016-11-27 MED ORDER — NEOSTIGMINE METHYLSULFATE 5 MG/5ML IV SOSY
PREFILLED_SYRINGE | INTRAVENOUS | Status: DC | PRN
  Administered 2016-11-27: 3 mg via INTRAVENOUS

## 2016-11-27 MED ORDER — HYDROMORPHONE HCL 1 MG/ML IJ SOLN
INTRAMUSCULAR | Status: AC
  Filled 2016-11-27: qty 0.5

## 2016-11-27 MED ORDER — MEPERIDINE HCL 25 MG/ML IJ SOLN: 6.2500 mg | INTRAMUSCULAR | Status: DC | PRN

## 2016-11-27 MED ORDER — HYDROMORPHONE HCL 4 MG PO TABS: 4.0000 mg | ORAL_TABLET | ORAL | 0 refills | Status: DC | PRN

## 2016-11-27 MED ORDER — CEFAZOLIN SODIUM-DEXTROSE 2-4 GM/100ML-% IV SOLN
2.0000 g | INTRAVENOUS | Status: AC
  Administered 2016-11-27: 2 g via INTRAVENOUS
  Filled 2016-11-27: qty 100

## 2016-11-27 MED ORDER — ROCURONIUM BROMIDE 10 MG/ML (PF) SYRINGE
PREFILLED_SYRINGE | INTRAVENOUS | Status: DC | PRN
Start: 2016-11-27 — End: 2016-11-27
  Administered 2016-11-27: 50 mg via INTRAVENOUS

## 2016-11-27 MED ORDER — PHENYLEPHRINE HCL 10 MG/ML IJ SOLN
INTRAMUSCULAR | Status: DC | PRN
  Administered 2016-11-27: 120 ug via INTRAVENOUS
  Administered 2016-11-27 (×2): 80 ug via INTRAVENOUS
  Administered 2016-11-27: 40 ug via INTRAVENOUS
  Administered 2016-11-27: 80 ug via INTRAVENOUS

## 2016-11-27 MED ORDER — ONDANSETRON HCL 4 MG/2ML IJ SOLN
INTRAMUSCULAR | Status: DC | PRN
  Administered 2016-11-27: 4 mg via INTRAVENOUS

## 2016-11-27 MED ORDER — PROMETHAZINE HCL 25 MG/ML IJ SOLN: 6.2500 mg | INTRAMUSCULAR | Status: DC | PRN

## 2016-11-27 MED ORDER — LACTATED RINGERS IV SOLN
INTRAVENOUS | Status: DC
  Administered 2016-11-27 (×2): via INTRAVENOUS

## 2016-11-27 MED ORDER — MIDAZOLAM HCL 5 MG/5ML IJ SOLN
INTRAMUSCULAR | Status: DC | PRN
  Administered 2016-11-27: 2 mg via INTRAVENOUS

## 2016-11-27 MED ORDER — SCOPOLAMINE 1 MG/3DAYS TD PT72
MEDICATED_PATCH | TRANSDERMAL | Status: AC
  Filled 2016-11-27: qty 1

## 2016-11-27 MED ORDER — GLYCOPYRROLATE 0.2 MG/ML IV SOSY
PREFILLED_SYRINGE | INTRAVENOUS | Status: DC | PRN
  Administered 2016-11-27: 0.4 mg via INTRAVENOUS

## 2016-11-27 MED ORDER — FENTANYL CITRATE (PF) 250 MCG/5ML IJ SOLN
INTRAMUSCULAR | Status: AC
Start: 2016-11-27 — End: 2016-11-27
  Filled 2016-11-27: qty 5

## 2016-11-27 MED ORDER — LIDOCAINE 2% (20 MG/ML) 5 ML SYRINGE
INTRAMUSCULAR | Status: DC | PRN
  Administered 2016-11-27: 50 mg via INTRAVENOUS

## 2016-11-27 MED ORDER — FENTANYL CITRATE (PF) 100 MCG/2ML IJ SOLN
100.0000 ug | Freq: Once | INTRAMUSCULAR | Status: AC
  Administered 2016-11-27: 100 ug via INTRAVENOUS
  Filled 2016-11-27: qty 2

## 2016-11-27 SURGICAL SUPPLY — 66 items
ANCH SUT SWLK 19.1X4.75 VT (Anchor) ×3 IMPLANT
ANCHOR PEEK 4.75X19.1 SWLK C (Anchor) ×6 IMPLANT
BLADE CUTTER GATOR 3.5 (BLADE) ×3 IMPLANT
BLADE GREAT WHITE 4.2 (BLADE) ×2 IMPLANT
BLADE GREAT WHITE 4.2MM (BLADE) ×1
BLADE SURG 11 STRL SS (BLADE) ×3 IMPLANT
BOOTCOVER CLEANROOM LRG (PROTECTIVE WEAR) ×6 IMPLANT
BUR OVAL 4.0 (BURR) ×3 IMPLANT
CANISTER SUCT LVC 12 LTR MEDI- (MISCELLANEOUS) ×3 IMPLANT
CANNULA ACUFLEX KIT 5X76 (CANNULA) ×5 IMPLANT
CANNULA DRILOCK 5.0MMX75MM (CANNULA) ×1
CANNULA DRILOCK 5.0X75 (CANNULA) ×1 IMPLANT
CANNULA TWIST IN 8.25X7CM (CANNULA) ×2 IMPLANT
CATH FOLEY LATEX FREE 16FR (CATHETERS) ×3
CATH FOLEY LF 16FR (CATHETERS) IMPLANT
CLOSURE WOUND 1/2 X4 (GAUZE/BANDAGES/DRESSINGS)
CONNECTOR 5 IN 1 STRAIGHT STRL (MISCELLANEOUS) ×3 IMPLANT
DRAPE INCISE 23X17 IOBAN STRL (DRAPES)
DRAPE INCISE 23X17 STRL (DRAPES) IMPLANT
DRAPE INCISE IOBAN 23X17 STRL (DRAPES) IMPLANT
DRAPE INCISE IOBAN 66X45 STRL (DRAPES) ×3 IMPLANT
DRAPE ORTHO SPLIT 77X108 STRL (DRAPES) ×6
DRAPE STERI 35X30 U-POUCH (DRAPES) ×3 IMPLANT
DRAPE SURG 17X11 SM STRL (DRAPES) ×3 IMPLANT
DRAPE SURG ORHT 6 SPLT 77X108 (DRAPES) ×2 IMPLANT
DRAPE U-SHAPE 47X51 STRL (DRAPES) ×3 IMPLANT
DRSG PAD ABDOMINAL 8X10 ST (GAUZE/BANDAGES/DRESSINGS) ×2 IMPLANT
DURAPREP 26ML APPLICATOR (WOUND CARE) ×6 IMPLANT
GAUZE SPONGE 4X4 12PLY STRL (GAUZE/BANDAGES/DRESSINGS) ×1 IMPLANT
GLOVE BIO SURGEON STRL SZ7.5 (GLOVE) ×3 IMPLANT
GLOVE BIO SURGEON STRL SZ8 (GLOVE) ×3 IMPLANT
GLOVE EUDERMIC 7 POWDERFREE (GLOVE) ×3 IMPLANT
GLOVE SS BIOGEL STRL SZ 7.5 (GLOVE) ×1 IMPLANT
GLOVE SUPERSENSE BIOGEL SZ 7.5 (GLOVE) ×2
GOWN STRL REUS W/ TWL LRG LVL3 (GOWN DISPOSABLE) ×1 IMPLANT
GOWN STRL REUS W/ TWL XL LVL3 (GOWN DISPOSABLE) ×2 IMPLANT
GOWN STRL REUS W/TWL LRG LVL3 (GOWN DISPOSABLE) ×3
GOWN STRL REUS W/TWL XL LVL3 (GOWN DISPOSABLE) ×6
KIT BASIN OR (CUSTOM PROCEDURE TRAY) ×3 IMPLANT
KIT ROOM TURNOVER OR (KITS) ×3 IMPLANT
KIT SHOULDER TRACTION (DRAPES) ×3 IMPLANT
MANIFOLD NEPTUNE II (INSTRUMENTS) ×3 IMPLANT
NDL SCORPION MULTI FIRE (NEEDLE) IMPLANT
NDL SPNL 18GX3.5 QUINCKE PK (NEEDLE) ×1 IMPLANT
NEEDLE SCORPION MULTI FIRE (NEEDLE) ×3 IMPLANT
NEEDLE SPNL 18GX3.5 QUINCKE PK (NEEDLE) ×3 IMPLANT
NS IRRIG 1000ML POUR BTL (IV SOLUTION) ×3 IMPLANT
PACK SHOULDER (CUSTOM PROCEDURE TRAY) ×3 IMPLANT
PAD ARMBOARD 7.5X6 YLW CONV (MISCELLANEOUS) ×6 IMPLANT
SET ARTHROSCOPY TUBING (MISCELLANEOUS) ×3
SET ARTHROSCOPY TUBING LN (MISCELLANEOUS) ×1 IMPLANT
SLING ARM FOAM STRAP LRG (SOFTGOODS) IMPLANT
SLING ARM FOAM STRAP MED (SOFTGOODS) ×3 IMPLANT
SPONGE LAP 4X18 X RAY DECT (DISPOSABLE) IMPLANT
STRIP CLOSURE SKIN 1/2X4 (GAUZE/BANDAGES/DRESSINGS) ×1 IMPLANT
SUT MNCRL AB 3-0 PS2 18 (SUTURE) ×3 IMPLANT
SUT PDS AB 0 CT 36 (SUTURE) IMPLANT
SUT TIGER TAPE 7 IN WHITE (SUTURE) IMPLANT
TAPE FIBER 2MM 7IN #2 BLUE (SUTURE) IMPLANT
TAPE PAPER 3X10 WHT MICROPORE (GAUZE/BANDAGES/DRESSINGS) ×3 IMPLANT
TOWEL OR 17X24 6PK STRL BLUE (TOWEL DISPOSABLE) ×1 IMPLANT
TOWEL OR 17X26 10 PK STRL BLUE (TOWEL DISPOSABLE) ×3 IMPLANT
TUBING ARTHROSCOPY IRRIG 16FT (MISCELLANEOUS) ×2 IMPLANT
WAND SERFAS ENERGY SUPER 90 (SURGICAL WAND) ×2 IMPLANT
WAND SUCTION MAX 4MM 90S (SURGICAL WAND) ×3 IMPLANT
WATER STERILE IRR 1000ML POUR (IV SOLUTION) ×3 IMPLANT

## 2016-11-27 NOTE — Anesthesia Postprocedure Evaluation (Signed)
Anesthesia Post Note  Patient: Alejandro Taylor  Procedure(s) Performed: Procedure(s) (LRB): SHOULDER ARTHROSCOPY WITH ROTATOR CUFF REPAIR AND SUBACROMIAL DECOMPRESSION, distal clavicular resection (Left)     Patient location during evaluation: PACU Anesthesia Type: General Level of consciousness: awake and alert Pain management: pain level controlled Vital Signs Assessment: post-procedure vital signs reviewed and stable Respiratory status: spontaneous breathing, nonlabored ventilation, respiratory function stable and patient connected to nasal cannula oxygen Cardiovascular status: blood pressure returned to baseline and stable Postop Assessment: no apparent nausea or vomiting Anesthetic complications: no    Last Vitals:  Vitals:   11/27/16 1555 11/27/16 1610  BP:  128/88  Pulse:  92  Resp:  14  Temp: (!) 36.2 C   SpO2:  100%    Last Pain:  Vitals:   11/27/16 1610  TempSrc:   PainSc: 0-No pain                 Shantinique Picazo S

## 2016-11-27 NOTE — H&P (Signed)
Alejandro Taylor    Chief Complaint: Left shoulder impingement, acromioclavicular arthritis and rotator cuff tear HPI: The patient is a 56 y.o. male with severe left shoulder pain and impingement with rotator cuff tear and symptoms refractory to conservative management.  Past Medical History:  Diagnosis Date  . Anginal pain ([place])   . Anxiety   . Arthritis   . CHF (congestive heart failure) ([place])   . Depression   . Diabetes mellitus without complication ([place])    Type II  . GERD (gastroesophageal reflux disease)   . Hypertension   . Myocardial infarction ([place])    x2  . On home oxygen therapy    only as needed for sleep apnea  . Pneumonia   . Rotator cuff injury, left, initial encounter   . Sleep apnea     Past Surgical History:  Procedure Laterality Date  . CORONARY ANGIOPLASTY     Stent x2  . CORONARY ARTERY BYPASS GRAFT    . ROTATOR CUFF REPAIR     Right    History reviewed. No pertinent family history.  Social History:  reports that he has quit smoking. His smoking use included Cigarettes. His smokeless tobacco use includes Chew. He reports that he does not drink alcohol or use drugs.   Medications Prior to Admission  Medication Sig Dispense Refill  . ALPRAZolam (XANAX) 0.5 MG tablet Take 0.125-0.25 mg by mouth 2 (two) times daily as needed for anxiety.    Marland Kitchen aspirin EC 81 MG tablet Take 81 mg by mouth at bedtime.    . clindamycin (CLINDAGEL) 1 % gel Apply 1 application topically 2 (two) times daily.    . colesevelam (WELCHOL) 625 MG tablet Take 1,875 mg by mouth 2 (two) times daily.    . furosemide (LASIX) 40 MG tablet Take 20 mg by mouth daily.    . insulin glargine (LANTUS) 100 UNIT/ML injection Inject 35 Units into the skin every evening.    . insulin lispro (HUMALOG) 100 UNIT/ML injection Inject 2-3 Units into the skin every evening.    Marland Kitchen ipratropium-albuterol (DUONEB) 0.5-2.5 (3) MG/3ML SOLN Inhale 3 mLs into the lungs 4 (four) times daily as needed for  shortness of breath.    . nystatin cream (MYCOSTATIN) Apply 1 application topically daily as needed (skin flares).     Marland Kitchen omeprazole (PRILOSEC) 40 MG capsule Take 40 mg by mouth daily.    Marland Kitchen oxycodone (ROXICODONE) 30 MG immediate release tablet Take 60 mg by mouth every 4 (four) hours.    . potassium chloride SA (K-DUR,KLOR-CON) 20 MEQ tablet Take 20 mEq by mouth 2 (two) times daily.    Marland Kitchen PROAIR HFA 108 (90 Base) MCG/ACT inhaler Inhale 2 puffs into the lungs every 6 (six) hours as needed for shortness of breath.    . psyllium (METAMUCIL) 0.52 g capsule Take 0.52 g by mouth 2 (two) times daily.    . tamsulosin (FLOMAX) 0.4 MG CAPS capsule Take 0.4 mg by mouth daily.    Marland Kitchen testosterone cypionate (DEPOTESTOSTERONE CYPIONATE) 200 MG/ML injection Inject 1 dose every 30 days    . OXYCONTIN 80 MG 12 hr tablet Take 40 mg by mouth 2 (two) times daily as needed (pain).     . Ticagrelor (BRILINTA PO) Take 45 mg by mouth 2 (two) times daily.       Physical Exam: left shoulder with painful and restricted motion as noted at recent office visits  Vitals  Temp:  [98.5 F (36.9 C)] 98.5 F (36.9  C) (09/13 1115) Pulse Rate:  [89] 89 (09/13 1115) Resp:  [18] 18 (09/13 1115) BP: (131)/(85) 131/85 (09/13 1115) SpO2:  [98 %] 98 % (09/13 1115) Weight:  [89.8 kg (198 lb)] 89.8 kg (198 lb) (09/13 1115)  Assessment/Plan  Impression: Left shoulder impingement, acromioclavicular arthritis and rotator cuff tear  Plan of Action: Procedure(s): SHOULDER ARTHROSCOPY WITH ROTATOR CUFF REPAIR AND SUBACROMIAL DECOMPRESSION, distal clavicular resection  Mekaela Azizi M Rhodes Calvert 11/27/2016, 12:17 PM Contact # [phone]

## 2016-11-27 NOTE — Op Note (Signed)
11/27/2016  2:49 PM  PATIENT:   Alejandro Taylor  56 y.o. male  PRE-OPERATIVE DIAGNOSIS:  Left shoulder impingement, acromioclavicular arthritis and rotator cuff tear  POST-OPERATIVE DIAGNOSIS:  Same with labral tear  PROCEDURE:  RSA, debridement, SAD, DCR, RCR  SURGEON:  Diamante Rubin, Metta Clines M.D.  ASSISTANTS: [place] pac   ANESTHESIA:   GET + ISB  EBL: min  SPECIMEN:  none  Drains: none   PATIENT DISPOSITION:  PACU - hemodynamically stable.    PLAN OF CARE: Discharge to home after PACU  Dictation# [id number]   Contact # [phone]

## 2016-11-27 NOTE — Progress Notes (Signed)
Report given to maria rn as caregiver 

## 2016-11-27 NOTE — Progress Notes (Signed)
Orthopedic Tech Progress Note Patient Details:  Alejandro Taylor 08-16-1960 [id number]  Patient ID: Alejandro Taylor, male   DOB: [birth year], 56 y.o.   MRN: [id number]   Alejandro Taylor 11/27/2016, 2:50 PMDon joy brace.

## 2016-11-27 NOTE — Discharge Instructions (Signed)
° °  Kevin M. Supple, M.D., F.A.A.O.S. °Orthopaedic Surgery °Specializing in Arthroscopic and Reconstructive °Surgery of the Shoulder and Knee °[phone] °[address] - , [place] [address] - Fax [phone] ° °POST-OP SHOULDER ARTHROSCOPIC ROTATOR CUFF AND/OR LABRAL REPAIR INSTRUCTIONS ° °1. Call the office at [phone] to schedule your first post-op appointment 7-10 days from the date of your surgery. ° °2. Leave the steri-strips in place over your incisions when performing dressing changes and showering. You may remove your dressings and begin showering 72 hours from surgery. You can expect drainage that is clear to bloody in nature that occasionally will soak through your dressings. If this occurs go ahead and perform a dressing change. The drainage should lessen daily and when there is no drainage from your incisions feel free to go without a dressing. ° °3. Wear your sling/immobilizer at all times except to perform the exercises below or to occasionally let your arm dangle by your side to stretch your elbow. You also need to sleep in your sling immobilizer until instructed otherwise. ° °4. Range of motion to your elbow, wrist, and hand are encouraged 3-5 times daily. Exercise to your hand and fingers helps to reduce swelling you may experience. ° °5. Utilize ice to the shoulder 3-4 times minimum a day and additionally if you are experiencing pain. ° °6. You may one-armed drive when safely off of narcotics and muscle relaxants. You may use your hand that is in the sling to support the steering wheel only. However, should it be your right arm that is in the sling it is not to be used for gear shifting in a manual transmission. ° °7. If you had a block pre-operatively to provide post-op pain relief you may want to go ahead and begin utilizing your pain meds as your arm begins to wake up. Blocks can sometimes last up to 16-18 hours. If you are still pain-free prior to going to bed you may  want to strongly consider taking a pain medication to avoid being awakened in the night with the onset of pain. A muscle relaxant is also provided for you should you experience muscle spasms. It is recommended that if you are experiencing pain that your pain medication alone is not controlling, add the muscle relaxant along with the pain medication which can give additional pain relief. The first one to two days is generally the most severe of your pain and then should gradually decrease. As your pain lessens it is recommended that you decrease your use of the pain medications to an "as needed basis" only and to always comply with the recommended dosages of the pain medications. ° °8. Pain medications can produce constipation along with their use. If you experience this, the use of an over the counter stool softener or laxative daily is recommended.  ° °9. For additional questions or concerns, please do not hesitate to call the office. If after hours there is an answering service to forward your concerns to the physician on call. ° °POST-OP EXERCISES ° °Pendulum Exercises ° °Perform pendulum exercises while standing and bending at the waist. Support your uninvolved arm on a table or chair and allow your operated arm to hang freely. Make sure to do these exercises passively - not using you shoulder muscle. ° °Repeat 20 times. Do 3 sessions per day. ° ° °

## 2016-11-27 NOTE — Anesthesia Procedure Notes (Signed)
Anesthesia Regional Block: Interscalene brachial plexus block   Pre-Anesthetic Checklist: ,, timeout performed, Correct Patient, Correct Site, Correct Laterality, Correct Procedure, Correct Position, site marked, Risks and benefits discussed,  Surgical consent,  Pre-op evaluation,  At surgeon's request and post-op pain management  Laterality: Left  Prep: chloraprep       Needles:  Injection technique: Single-shot  Needle Type: Echogenic Needle     Needle Length: 9cm  Needle Gauge: 21     Additional Needles:   Procedures: ultrasound guided,,,,,,,,  Narrative:  Start time: 11/27/2016 11:50 AM End time: 11/27/2016 12:00 PM Injection made incrementally with aspirations every 5 mL.  Performed by: Personally  Anesthesiologist: Suella Broad D  Additional Notes: Tolerated well. No issues.

## 2016-11-27 NOTE — Anesthesia Preprocedure Evaluation (Addendum)
Anesthesia Evaluation  Patient identified by MRN, date of birth, ID band Patient awake    Reviewed: Allergy & Precautions, NPO status , Patient's Chart, lab work & pertinent test results  Airway Mallampati: I  TM Distance: >3 FB Neck ROM: Full    Dental  (+) Dental Advisory Given, Edentulous Upper   Pulmonary neg pulmonary ROS, sleep apnea and Oxygen sleep apnea , former smoker,    breath sounds clear to auscultation       Cardiovascular hypertension, Pt. on medications + CAD, + Past MI, + Cardiac Stents, + CABG and +CHF  negative cardio ROS   Rhythm:Regular Rate:Normal     Neuro/Psych PSYCHIATRIC DISORDERS Anxiety Depression negative neurological ROS     GI/Hepatic Neg liver ROS, GERD  Medicated,  Endo/Other  diabetes, Type 2, Insulin Dependent  Renal/GU negative Renal ROS     Musculoskeletal  (+) Arthritis ,   Abdominal   Peds  Hematology negative hematology ROS (+)   Anesthesia Other Findings Day of surgery medications reviewed with the patient.  Reproductive/Obstetrics                             2. Borderline LVE with apical hypokinesis with remaining wall function low normal LVEF 53%.   Anesthesia Physical Anesthesia Plan  ASA: III  Anesthesia Plan: General   Post-op Pain Management:  Regional for Post-op pain   Induction: Intravenous  PONV Risk Score and Plan: 3 and Ondansetron, Dexamethasone, Midazolam and Treatment may vary due to age or medical condition  Airway Management Planned: Oral ETT  Additional Equipment:   Intra-op Plan:   Post-operative Plan: Extubation in OR  Informed Consent: I have reviewed the patients History and Physical, chart, labs and discussed the procedure including the risks, benefits and alternatives for the proposed anesthesia with the patient or authorized representative who has indicated his/her understanding and acceptance.   Dental  advisory given  Plan Discussed with: CRNA  Anesthesia Plan Comments:         Anesthesia Quick Evaluation

## 2016-11-27 NOTE — Transfer of Care (Signed)
Immediate Anesthesia Transfer of Care Note  Patient: Alejandro Taylor  Procedure(s) Performed: Procedure(s): SHOULDER ARTHROSCOPY WITH ROTATOR CUFF REPAIR AND SUBACROMIAL DECOMPRESSION, distal clavicular resection (Left)  Patient Location: PACU  Anesthesia Type:General  Level of Consciousness: awake, patient cooperative and responds to stimulation  Airway & Oxygen Therapy: Patient Spontanous Breathing and Patient connected to nasal cannula oxygen  Post-op Assessment: Report given to RN and Post -op Vital signs reviewed and stable  Post vital signs: Reviewed and stable  Last Vitals:  Vitals:   11/27/16 1115 11/27/16 1509  BP: 131/85 (P) 132/88  Pulse: 89 (!) (P) 109  Resp: 18 (P) 20  Temp: 36.9 C (!) (P) 36.2 C  SpO2: 98% (P) 98%    Last Pain:  Vitals:   11/27/16 1509  TempSrc:   PainSc: (P) 0-No pain      Patients Stated Pain Goal: 3 (35/57/32 2025)  Complications: No apparent anesthesia complications

## 2016-11-28 NOTE — Op Note (Signed)
NAMEKIETH, HARTIS NO.:  [id number]  MEDICAL RECORD NO.:  [id number]  LOCATION:                                 FACILITY:  PHYSICIAN:  Metta Clines. Corrin Hingle, M.D.       DATE OF BIRTH:  DATE OF PROCEDURE:  11/27/2016 DATE OF DISCHARGE:                              OPERATIVE REPORT   PREOPERATIVE DIAGNOSES: 1. Chronic left shoulder pain with impingement syndrome. 2. Left shoulder symptomatic acromioclavicular joint arthritis. 3. Left shoulder full-thickness rotator cuff tear.  POSTOPERATIVE DIAGNOSES: 1. Chronic left shoulder pain with impingement syndrome. 2. Left shoulder symptomatic acromioclavicular joint arthritis. 3. Left shoulder full-thickness rotator cuff tear. 4. Left shoulder complex and extensive degenerative labral tear.  PROCEDURES: 1. Left shoulder examination under anesthesia. 2. Left shoulder glenohumeral joint diagnostic arthroscopy. 3. Labral debridement. 4. Arthroscopic subacromial decompression and bursectomy. 5. Arthroscopic distal clavicle resection. 6. Arthroscopic rotator cuff repair using a double-row SutureBridge     repair construct.  SURGEON:  Metta Clines. Cande Mastropietro, MD.  Terrence DupontReather Laurence. Shuford, PA-C.  ANESTHESIA:  General endotracheal as well as interscalene block.  ESTIMATED BLOOD LOSS:  Minimal.  DRAINS:  None.  HISTORY:  [name] is a 56 year old gentleman who has had persistent left shoulder pain and associated functional limitations, refractory to attempts at conservative management.  His preoperative MRI scan confirms a full-thickness rotator cuff tear.  Due to his ongoing pain and functional limitations, he is brought to the operating room at this time for planned left shoulder arthroscopy as described below.  Preoperatively, I counseled Mr. Dehn regarding treatment options and potential risks versus benefits thereof.  Possible surgical complications were reviewed including bleeding, infection,  neurovascular injury, persistent pain, loss of motion, anesthetic complication, recurrence of rotator cuff tear, and possible need for additional surgery.  He understands and accepts and agrees with our planned procedure.  PROCEDURE IN DETAIL:  After undergoing routine preop evaluation, the patient received prophylactic antibiotics.  An interscalene block was established in the holding area by the Anesthesia Department.  Placed supine on the operating table.  Underwent smooth induction of a general endotracheal anesthesia.  Turned to the right lateral decubitus position on a beanbag and appropriately padded and protected.  Left shoulder examination under anesthesia revealed full motion.  No instability patterns noted.  Left arm then suspended at 70 degrees of abduction with 10 pounds of traction.  Left shoulder girdle region was sterilely prepped and draped in a standard fashion.  Time-out was called. Posterior portal was established into the glenohumeral joint and anterior portal was established under direct visualization.  Articular surfaces were all in excellent condition.  Capsular volume was within normal limits.  There was extensive degenerative labral tearing over the superior half of the glenoid and this was all debrided with a shaver back to a stable margin.  The biceps tendon showed normal caliber with no evidence of proximal or distal instability.  There was an obvious full-thickness defect of the rotator cuff involving the distal supraspinatus.  We completed inspection within the glenohumeral joint. Fluid and instruments were removed.  The arm was then dropped into 30 degrees of abduction.  Arthroscope was introduced into the subacromial space of  posterior portal, and a direct lateral portal was established in the subacromial space.  Abundant dense bursal tissue and multiple adhesions were encountered and these were all divided and excised from the shaver and Stryker wand.   Wand was then used to remove the periosteum from the undersurface of the anterior half of the acromion and then a subacromial decompression was performed with a bur creating a type 1 morphology.  The portal was then established directly anterior to the distal clavicle, and distal clavicle resection was performed with a bur with care taken to confirm visualization of the entire circumference of the distal clavicle to ensure adequate removal of bone.  We then completed the subacromial/subdeltoid bursectomy.  We readily identified the rotator cuff tear from the bursal side and this was trimmed with a shaver back to a margin of healthy tissue and removed this residual soft tissue in the apex of the tuberosity with a Stryker wand and then abrading the bone to bleeding bed with footprint approximately 3 cm in width.  Of note, there were some complex patterns to the rotator cuff tear __________.  At this point, through a stab wound of the lateral margin of the acromion, placed an Arthrex PEEK SwiveLock suture anchor, loaded with 2 FiberTapes.  All 6 suture limbs were then shuttled equidistant across the width of the rotator cuff tear with the Scorpion suture passer.  We then placed 2 lateral anchors, which nicely brought the tendon of the rotator cuff against the footprint on the tuberosity. Overall construct was much to our satisfaction.  Suture limbs were all then clipped.  Final debridement was completed.  Hemostasis was obtained.  Fluid and instruments were removed.  The portals were closed with Monocryl and Steri-Strips.  A dry dressing taped about the left shoulder.  Left arm was placed into a sling, and the patient was then awakened, extubated, and taken to the recovery room in stable condition.  Jenetta Loges, PA-C, was used as an Environmental consultant throughout this case, was essential for help with positioning the patient, positioning the extremity, tissue manipulation, suture management, wound  closure, and intraoperative decision making.     Metta Clines. Javiel Canepa, M.D.   ______________________________ Metta Clines. Natara Monfort, M.D.   KMS/MEDQ  D:  11/27/2016  T:  11/28/2016  Job:  [id number]

## 2016-12-01 ENCOUNTER — Encounter (HOSPITAL_COMMUNITY): Payer: Self-pay | Admitting: Orthopedic Surgery

## 2016-12-08 DIAGNOSIS — S2220XA Unspecified fracture of sternum, initial encounter for closed fracture: Secondary | ICD-10-CM | POA: Diagnosis not present

## 2016-12-08 DIAGNOSIS — Z6830 Body mass index (BMI) 30.0-30.9, adult: Secondary | ICD-10-CM | POA: Diagnosis not present

## 2016-12-08 DIAGNOSIS — I10 Essential (primary) hypertension: Secondary | ICD-10-CM | POA: Diagnosis not present

## 2016-12-08 DIAGNOSIS — Z4789 Encounter for other orthopedic aftercare: Secondary | ICD-10-CM | POA: Diagnosis not present

## 2016-12-08 DIAGNOSIS — M5030 Other cervical disc degeneration, unspecified cervical region: Secondary | ICD-10-CM | POA: Diagnosis not present

## 2016-12-08 DIAGNOSIS — I2510 Atherosclerotic heart disease of native coronary artery without angina pectoris: Secondary | ICD-10-CM | POA: Diagnosis not present

## 2016-12-08 DIAGNOSIS — G8929 Other chronic pain: Secondary | ICD-10-CM | POA: Diagnosis not present

## 2016-12-08 DIAGNOSIS — M5137 Other intervertebral disc degeneration, lumbosacral region: Secondary | ICD-10-CM | POA: Diagnosis not present

## 2016-12-08 DIAGNOSIS — E291 Testicular hypofunction: Secondary | ICD-10-CM | POA: Diagnosis not present

## 2016-12-11 DIAGNOSIS — L0100 Impetigo, unspecified: Secondary | ICD-10-CM | POA: Diagnosis not present

## 2016-12-11 DIAGNOSIS — B370 Candidal stomatitis: Secondary | ICD-10-CM | POA: Diagnosis not present

## 2017-01-05 DIAGNOSIS — Z23 Encounter for immunization: Secondary | ICD-10-CM | POA: Diagnosis not present

## 2017-01-05 DIAGNOSIS — E291 Testicular hypofunction: Secondary | ICD-10-CM | POA: Diagnosis not present

## 2017-01-05 DIAGNOSIS — M5030 Other cervical disc degeneration, unspecified cervical region: Secondary | ICD-10-CM | POA: Diagnosis not present

## 2017-01-05 DIAGNOSIS — S2220XA Unspecified fracture of sternum, initial encounter for closed fracture: Secondary | ICD-10-CM | POA: Diagnosis not present

## 2017-01-05 DIAGNOSIS — G8929 Other chronic pain: Secondary | ICD-10-CM | POA: Diagnosis not present

## 2017-01-05 DIAGNOSIS — I2510 Atherosclerotic heart disease of native coronary artery without angina pectoris: Secondary | ICD-10-CM | POA: Diagnosis not present

## 2017-01-05 DIAGNOSIS — I10 Essential (primary) hypertension: Secondary | ICD-10-CM | POA: Diagnosis not present

## 2017-01-05 DIAGNOSIS — M5137 Other intervertebral disc degeneration, lumbosacral region: Secondary | ICD-10-CM | POA: Diagnosis not present

## 2017-01-07 DIAGNOSIS — Z4789 Encounter for other orthopedic aftercare: Secondary | ICD-10-CM | POA: Diagnosis not present

## 2017-01-29 DIAGNOSIS — G8929 Other chronic pain: Secondary | ICD-10-CM | POA: Diagnosis not present

## 2017-01-29 DIAGNOSIS — Z6830 Body mass index (BMI) 30.0-30.9, adult: Secondary | ICD-10-CM | POA: Diagnosis not present

## 2017-01-29 DIAGNOSIS — I10 Essential (primary) hypertension: Secondary | ICD-10-CM | POA: Diagnosis not present

## 2017-01-29 DIAGNOSIS — M5137 Other intervertebral disc degeneration, lumbosacral region: Secondary | ICD-10-CM | POA: Diagnosis not present

## 2017-01-29 DIAGNOSIS — E291 Testicular hypofunction: Secondary | ICD-10-CM | POA: Diagnosis not present

## 2017-01-29 DIAGNOSIS — S2220XA Unspecified fracture of sternum, initial encounter for closed fracture: Secondary | ICD-10-CM | POA: Diagnosis not present

## 2017-01-29 DIAGNOSIS — M5030 Other cervical disc degeneration, unspecified cervical region: Secondary | ICD-10-CM | POA: Diagnosis not present

## 2017-01-29 DIAGNOSIS — I2510 Atherosclerotic heart disease of native coronary artery without angina pectoris: Secondary | ICD-10-CM | POA: Diagnosis not present

## 2017-02-11 ENCOUNTER — Other Ambulatory Visit: Payer: Self-pay | Admitting: Cardiology

## 2017-02-13 DIAGNOSIS — E291 Testicular hypofunction: Secondary | ICD-10-CM | POA: Diagnosis not present

## 2017-03-02 DIAGNOSIS — M5030 Other cervical disc degeneration, unspecified cervical region: Secondary | ICD-10-CM | POA: Diagnosis not present

## 2017-03-02 DIAGNOSIS — I10 Essential (primary) hypertension: Secondary | ICD-10-CM | POA: Diagnosis not present

## 2017-03-02 DIAGNOSIS — E1140 Type 2 diabetes mellitus with diabetic neuropathy, unspecified: Secondary | ICD-10-CM | POA: Diagnosis not present

## 2017-03-02 DIAGNOSIS — E785 Hyperlipidemia, unspecified: Secondary | ICD-10-CM | POA: Diagnosis not present

## 2017-03-02 DIAGNOSIS — M5137 Other intervertebral disc degeneration, lumbosacral region: Secondary | ICD-10-CM | POA: Diagnosis not present

## 2017-03-02 DIAGNOSIS — G8929 Other chronic pain: Secondary | ICD-10-CM | POA: Diagnosis not present

## 2017-03-02 DIAGNOSIS — E291 Testicular hypofunction: Secondary | ICD-10-CM | POA: Diagnosis not present

## 2017-03-02 DIAGNOSIS — I2510 Atherosclerotic heart disease of native coronary artery without angina pectoris: Secondary | ICD-10-CM | POA: Diagnosis not present

## 2017-03-02 DIAGNOSIS — Z79899 Other long term (current) drug therapy: Secondary | ICD-10-CM | POA: Diagnosis not present

## 2017-03-02 DIAGNOSIS — Z6830 Body mass index (BMI) 30.0-30.9, adult: Secondary | ICD-10-CM | POA: Diagnosis not present

## 2017-03-02 DIAGNOSIS — J019 Acute sinusitis, unspecified: Secondary | ICD-10-CM | POA: Diagnosis not present

## 2017-03-02 DIAGNOSIS — S2220XA Unspecified fracture of sternum, initial encounter for closed fracture: Secondary | ICD-10-CM | POA: Diagnosis not present

## 2017-03-18 DIAGNOSIS — E291 Testicular hypofunction: Secondary | ICD-10-CM | POA: Diagnosis not present

## 2017-03-23 DIAGNOSIS — Z955 Presence of coronary angioplasty implant and graft: Secondary | ICD-10-CM | POA: Diagnosis not present

## 2017-03-23 DIAGNOSIS — I4891 Unspecified atrial fibrillation: Secondary | ICD-10-CM | POA: Diagnosis present

## 2017-03-23 DIAGNOSIS — I2510 Atherosclerotic heart disease of native coronary artery without angina pectoris: Secondary | ICD-10-CM | POA: Diagnosis not present

## 2017-03-23 DIAGNOSIS — G8929 Other chronic pain: Secondary | ICD-10-CM | POA: Diagnosis present

## 2017-03-23 DIAGNOSIS — I252 Old myocardial infarction: Secondary | ICD-10-CM | POA: Diagnosis not present

## 2017-03-23 DIAGNOSIS — Z7982 Long term (current) use of aspirin: Secondary | ICD-10-CM | POA: Diagnosis not present

## 2017-03-23 DIAGNOSIS — Z951 Presence of aortocoronary bypass graft: Secondary | ICD-10-CM | POA: Diagnosis not present

## 2017-03-23 DIAGNOSIS — F419 Anxiety disorder, unspecified: Secondary | ICD-10-CM | POA: Diagnosis present

## 2017-03-23 DIAGNOSIS — Z87891 Personal history of nicotine dependence: Secondary | ICD-10-CM | POA: Diagnosis not present

## 2017-03-23 DIAGNOSIS — I25110 Atherosclerotic heart disease of native coronary artery with unstable angina pectoris: Secondary | ICD-10-CM | POA: Diagnosis not present

## 2017-03-23 DIAGNOSIS — R0789 Other chest pain: Secondary | ICD-10-CM | POA: Diagnosis not present

## 2017-03-23 DIAGNOSIS — E119 Type 2 diabetes mellitus without complications: Secondary | ICD-10-CM | POA: Diagnosis not present

## 2017-03-23 DIAGNOSIS — E785 Hyperlipidemia, unspecified: Secondary | ICD-10-CM | POA: Diagnosis not present

## 2017-03-23 DIAGNOSIS — J069 Acute upper respiratory infection, unspecified: Secondary | ICD-10-CM | POA: Diagnosis not present

## 2017-03-23 DIAGNOSIS — M7918 Myalgia, other site: Secondary | ICD-10-CM | POA: Diagnosis present

## 2017-03-23 DIAGNOSIS — I214 Non-ST elevation (NSTEMI) myocardial infarction: Secondary | ICD-10-CM | POA: Diagnosis not present

## 2017-03-23 DIAGNOSIS — R079 Chest pain, unspecified: Secondary | ICD-10-CM | POA: Diagnosis not present

## 2017-03-23 DIAGNOSIS — Z79899 Other long term (current) drug therapy: Secondary | ICD-10-CM | POA: Diagnosis not present

## 2017-03-23 DIAGNOSIS — Z794 Long term (current) use of insulin: Secondary | ICD-10-CM | POA: Diagnosis not present

## 2017-03-23 DIAGNOSIS — I259 Chronic ischemic heart disease, unspecified: Secondary | ICD-10-CM | POA: Diagnosis not present

## 2017-03-23 DIAGNOSIS — J449 Chronic obstructive pulmonary disease, unspecified: Secondary | ICD-10-CM | POA: Diagnosis present

## 2017-03-23 DIAGNOSIS — I10 Essential (primary) hypertension: Secondary | ICD-10-CM | POA: Diagnosis not present

## 2017-03-23 DIAGNOSIS — M1990 Unspecified osteoarthritis, unspecified site: Secondary | ICD-10-CM | POA: Diagnosis present

## 2017-03-24 DIAGNOSIS — I255 Ischemic cardiomyopathy: Secondary | ICD-10-CM | POA: Diagnosis present

## 2017-03-24 DIAGNOSIS — I214 Non-ST elevation (NSTEMI) myocardial infarction: Secondary | ICD-10-CM | POA: Diagnosis not present

## 2017-03-24 DIAGNOSIS — R079 Chest pain, unspecified: Secondary | ICD-10-CM | POA: Diagnosis not present

## 2017-03-24 DIAGNOSIS — Z7982 Long term (current) use of aspirin: Secondary | ICD-10-CM | POA: Diagnosis not present

## 2017-03-24 DIAGNOSIS — I25810 Atherosclerosis of coronary artery bypass graft(s) without angina pectoris: Secondary | ICD-10-CM | POA: Diagnosis not present

## 2017-03-24 DIAGNOSIS — I2510 Atherosclerotic heart disease of native coronary artery without angina pectoris: Secondary | ICD-10-CM | POA: Diagnosis not present

## 2017-03-24 DIAGNOSIS — I10 Essential (primary) hypertension: Secondary | ICD-10-CM | POA: Diagnosis not present

## 2017-03-24 DIAGNOSIS — E785 Hyperlipidemia, unspecified: Secondary | ICD-10-CM | POA: Diagnosis not present

## 2017-03-24 DIAGNOSIS — Z951 Presence of aortocoronary bypass graft: Secondary | ICD-10-CM | POA: Diagnosis not present

## 2017-03-24 DIAGNOSIS — Z794 Long term (current) use of insulin: Secondary | ICD-10-CM | POA: Diagnosis not present

## 2017-03-24 DIAGNOSIS — I209 Angina pectoris, unspecified: Secondary | ICD-10-CM | POA: Insufficient documentation

## 2017-03-24 DIAGNOSIS — I252 Old myocardial infarction: Secondary | ICD-10-CM | POA: Diagnosis not present

## 2017-03-24 DIAGNOSIS — J069 Acute upper respiratory infection, unspecified: Secondary | ICD-10-CM | POA: Diagnosis not present

## 2017-03-24 DIAGNOSIS — E7849 Other hyperlipidemia: Secondary | ICD-10-CM | POA: Diagnosis not present

## 2017-03-24 DIAGNOSIS — I2582 Chronic total occlusion of coronary artery: Secondary | ICD-10-CM | POA: Diagnosis not present

## 2017-03-24 DIAGNOSIS — Z955 Presence of coronary angioplasty implant and graft: Secondary | ICD-10-CM | POA: Diagnosis not present

## 2017-03-24 DIAGNOSIS — R0602 Shortness of breath: Secondary | ICD-10-CM | POA: Diagnosis not present

## 2017-03-24 DIAGNOSIS — I25118 Atherosclerotic heart disease of native coronary artery with other forms of angina pectoris: Secondary | ICD-10-CM | POA: Diagnosis not present

## 2017-03-24 DIAGNOSIS — R748 Abnormal levels of other serum enzymes: Secondary | ICD-10-CM | POA: Diagnosis not present

## 2017-03-24 DIAGNOSIS — R0789 Other chest pain: Secondary | ICD-10-CM | POA: Diagnosis not present

## 2017-03-24 DIAGNOSIS — I25708 Atherosclerosis of coronary artery bypass graft(s), unspecified, with other forms of angina pectoris: Secondary | ICD-10-CM | POA: Diagnosis not present

## 2017-03-24 DIAGNOSIS — E119 Type 2 diabetes mellitus without complications: Secondary | ICD-10-CM | POA: Diagnosis not present

## 2017-03-24 DIAGNOSIS — I25119 Atherosclerotic heart disease of native coronary artery with unspecified angina pectoris: Secondary | ICD-10-CM | POA: Diagnosis present

## 2017-03-28 DIAGNOSIS — I2510 Atherosclerotic heart disease of native coronary artery without angina pectoris: Secondary | ICD-10-CM | POA: Diagnosis not present

## 2017-03-28 DIAGNOSIS — G8929 Other chronic pain: Secondary | ICD-10-CM | POA: Diagnosis not present

## 2017-03-28 DIAGNOSIS — M5030 Other cervical disc degeneration, unspecified cervical region: Secondary | ICD-10-CM | POA: Diagnosis not present

## 2017-03-28 DIAGNOSIS — I214 Non-ST elevation (NSTEMI) myocardial infarction: Secondary | ICD-10-CM | POA: Diagnosis not present

## 2017-03-28 DIAGNOSIS — M5137 Other intervertebral disc degeneration, lumbosacral region: Secondary | ICD-10-CM | POA: Diagnosis not present

## 2017-03-28 DIAGNOSIS — E291 Testicular hypofunction: Secondary | ICD-10-CM | POA: Diagnosis not present

## 2017-03-28 DIAGNOSIS — S2220XA Unspecified fracture of sternum, initial encounter for closed fracture: Secondary | ICD-10-CM | POA: Diagnosis not present

## 2017-03-28 DIAGNOSIS — I10 Essential (primary) hypertension: Secondary | ICD-10-CM | POA: Diagnosis not present

## 2017-04-11 DIAGNOSIS — E119 Type 2 diabetes mellitus without complications: Secondary | ICD-10-CM | POA: Diagnosis not present

## 2017-04-13 DIAGNOSIS — E291 Testicular hypofunction: Secondary | ICD-10-CM | POA: Diagnosis not present

## 2017-04-15 DIAGNOSIS — I252 Old myocardial infarction: Secondary | ICD-10-CM | POA: Diagnosis not present

## 2017-04-15 DIAGNOSIS — Z951 Presence of aortocoronary bypass graft: Secondary | ICD-10-CM | POA: Diagnosis not present

## 2017-04-15 DIAGNOSIS — Z955 Presence of coronary angioplasty implant and graft: Secondary | ICD-10-CM | POA: Diagnosis not present

## 2017-04-15 DIAGNOSIS — E785 Hyperlipidemia, unspecified: Secondary | ICD-10-CM | POA: Diagnosis not present

## 2017-04-15 DIAGNOSIS — I10 Essential (primary) hypertension: Secondary | ICD-10-CM | POA: Diagnosis not present

## 2017-04-15 DIAGNOSIS — I2510 Atherosclerotic heart disease of native coronary artery without angina pectoris: Secondary | ICD-10-CM | POA: Diagnosis not present

## 2017-04-27 DIAGNOSIS — M5137 Other intervertebral disc degeneration, lumbosacral region: Secondary | ICD-10-CM | POA: Diagnosis not present

## 2017-04-27 DIAGNOSIS — Z6830 Body mass index (BMI) 30.0-30.9, adult: Secondary | ICD-10-CM | POA: Diagnosis not present

## 2017-04-27 DIAGNOSIS — M5030 Other cervical disc degeneration, unspecified cervical region: Secondary | ICD-10-CM | POA: Diagnosis not present

## 2017-04-27 DIAGNOSIS — E291 Testicular hypofunction: Secondary | ICD-10-CM | POA: Diagnosis not present

## 2017-04-27 DIAGNOSIS — G8929 Other chronic pain: Secondary | ICD-10-CM | POA: Diagnosis not present

## 2017-04-27 DIAGNOSIS — S2220XA Unspecified fracture of sternum, initial encounter for closed fracture: Secondary | ICD-10-CM | POA: Diagnosis not present

## 2017-04-27 DIAGNOSIS — I2510 Atherosclerotic heart disease of native coronary artery without angina pectoris: Secondary | ICD-10-CM | POA: Diagnosis not present

## 2017-04-27 DIAGNOSIS — I10 Essential (primary) hypertension: Secondary | ICD-10-CM | POA: Diagnosis not present

## 2017-05-11 DIAGNOSIS — E291 Testicular hypofunction: Secondary | ICD-10-CM | POA: Diagnosis not present

## 2017-05-14 DIAGNOSIS — M79671 Pain in right foot: Secondary | ICD-10-CM | POA: Diagnosis not present

## 2017-05-14 DIAGNOSIS — M25561 Pain in right knee: Secondary | ICD-10-CM | POA: Diagnosis not present

## 2017-05-15 DIAGNOSIS — M7989 Other specified soft tissue disorders: Secondary | ICD-10-CM | POA: Diagnosis not present

## 2017-05-15 DIAGNOSIS — M79671 Pain in right foot: Secondary | ICD-10-CM | POA: Diagnosis not present

## 2017-05-15 DIAGNOSIS — M25561 Pain in right knee: Secondary | ICD-10-CM | POA: Diagnosis not present

## 2017-05-25 DIAGNOSIS — M5030 Other cervical disc degeneration, unspecified cervical region: Secondary | ICD-10-CM | POA: Diagnosis not present

## 2017-05-25 DIAGNOSIS — Z79899 Other long term (current) drug therapy: Secondary | ICD-10-CM | POA: Diagnosis not present

## 2017-05-25 DIAGNOSIS — E785 Hyperlipidemia, unspecified: Secondary | ICD-10-CM | POA: Diagnosis not present

## 2017-05-25 DIAGNOSIS — E1140 Type 2 diabetes mellitus with diabetic neuropathy, unspecified: Secondary | ICD-10-CM | POA: Diagnosis not present

## 2017-05-25 DIAGNOSIS — I2510 Atherosclerotic heart disease of native coronary artery without angina pectoris: Secondary | ICD-10-CM | POA: Diagnosis not present

## 2017-05-25 DIAGNOSIS — I10 Essential (primary) hypertension: Secondary | ICD-10-CM | POA: Diagnosis not present

## 2017-05-25 DIAGNOSIS — G8929 Other chronic pain: Secondary | ICD-10-CM | POA: Diagnosis not present

## 2017-05-25 DIAGNOSIS — S2220XA Unspecified fracture of sternum, initial encounter for closed fracture: Secondary | ICD-10-CM | POA: Diagnosis not present

## 2017-05-25 DIAGNOSIS — Z6830 Body mass index (BMI) 30.0-30.9, adult: Secondary | ICD-10-CM | POA: Diagnosis not present

## 2017-05-25 DIAGNOSIS — E291 Testicular hypofunction: Secondary | ICD-10-CM | POA: Diagnosis not present

## 2017-05-25 DIAGNOSIS — Z125 Encounter for screening for malignant neoplasm of prostate: Secondary | ICD-10-CM | POA: Diagnosis not present

## 2017-05-25 DIAGNOSIS — M5137 Other intervertebral disc degeneration, lumbosacral region: Secondary | ICD-10-CM | POA: Diagnosis not present

## 2017-05-29 DIAGNOSIS — M2391 Unspecified internal derangement of right knee: Secondary | ICD-10-CM | POA: Diagnosis not present

## 2017-05-29 DIAGNOSIS — M238X1 Other internal derangements of right knee: Secondary | ICD-10-CM | POA: Diagnosis not present

## 2017-06-05 DIAGNOSIS — M25561 Pain in right knee: Secondary | ICD-10-CM | POA: Diagnosis not present

## 2017-06-08 DIAGNOSIS — E291 Testicular hypofunction: Secondary | ICD-10-CM | POA: Diagnosis not present

## 2017-06-16 DIAGNOSIS — S83231A Complex tear of medial meniscus, current injury, right knee, initial encounter: Secondary | ICD-10-CM | POA: Diagnosis not present

## 2017-06-16 DIAGNOSIS — M238X1 Other internal derangements of right knee: Secondary | ICD-10-CM | POA: Diagnosis not present

## 2017-06-16 DIAGNOSIS — S83249A Other tear of medial meniscus, current injury, unspecified knee, initial encounter: Secondary | ICD-10-CM | POA: Insufficient documentation

## 2017-06-16 DIAGNOSIS — M2391 Unspecified internal derangement of right knee: Secondary | ICD-10-CM | POA: Diagnosis not present

## 2017-06-22 DIAGNOSIS — E291 Testicular hypofunction: Secondary | ICD-10-CM | POA: Diagnosis not present

## 2017-06-22 DIAGNOSIS — S2220XA Unspecified fracture of sternum, initial encounter for closed fracture: Secondary | ICD-10-CM | POA: Diagnosis not present

## 2017-06-22 DIAGNOSIS — M5030 Other cervical disc degeneration, unspecified cervical region: Secondary | ICD-10-CM | POA: Diagnosis not present

## 2017-06-22 DIAGNOSIS — M5137 Other intervertebral disc degeneration, lumbosacral region: Secondary | ICD-10-CM | POA: Diagnosis not present

## 2017-06-22 DIAGNOSIS — I10 Essential (primary) hypertension: Secondary | ICD-10-CM | POA: Diagnosis not present

## 2017-06-22 DIAGNOSIS — Z6830 Body mass index (BMI) 30.0-30.9, adult: Secondary | ICD-10-CM | POA: Diagnosis not present

## 2017-06-22 DIAGNOSIS — G8929 Other chronic pain: Secondary | ICD-10-CM | POA: Diagnosis not present

## 2017-06-22 DIAGNOSIS — I2510 Atherosclerotic heart disease of native coronary artery without angina pectoris: Secondary | ICD-10-CM | POA: Diagnosis not present

## 2017-07-06 DIAGNOSIS — E291 Testicular hypofunction: Secondary | ICD-10-CM | POA: Diagnosis not present

## 2017-07-20 DIAGNOSIS — M5137 Other intervertebral disc degeneration, lumbosacral region: Secondary | ICD-10-CM | POA: Diagnosis not present

## 2017-07-20 DIAGNOSIS — E291 Testicular hypofunction: Secondary | ICD-10-CM | POA: Diagnosis not present

## 2017-07-20 DIAGNOSIS — G8929 Other chronic pain: Secondary | ICD-10-CM | POA: Diagnosis not present

## 2017-07-20 DIAGNOSIS — I10 Essential (primary) hypertension: Secondary | ICD-10-CM | POA: Diagnosis not present

## 2017-07-20 DIAGNOSIS — M5030 Other cervical disc degeneration, unspecified cervical region: Secondary | ICD-10-CM | POA: Diagnosis not present

## 2017-07-29 DIAGNOSIS — I2510 Atherosclerotic heart disease of native coronary artery without angina pectoris: Secondary | ICD-10-CM | POA: Diagnosis not present

## 2017-07-29 DIAGNOSIS — E785 Hyperlipidemia, unspecified: Secondary | ICD-10-CM | POA: Diagnosis not present

## 2017-07-29 DIAGNOSIS — Z951 Presence of aortocoronary bypass graft: Secondary | ICD-10-CM | POA: Diagnosis not present

## 2017-07-29 DIAGNOSIS — I252 Old myocardial infarction: Secondary | ICD-10-CM | POA: Diagnosis not present

## 2017-07-29 DIAGNOSIS — I10 Essential (primary) hypertension: Secondary | ICD-10-CM | POA: Diagnosis not present

## 2017-07-29 DIAGNOSIS — Z955 Presence of coronary angioplasty implant and graft: Secondary | ICD-10-CM | POA: Diagnosis not present

## 2017-08-03 DIAGNOSIS — N529 Male erectile dysfunction, unspecified: Secondary | ICD-10-CM | POA: Diagnosis not present

## 2017-08-17 DIAGNOSIS — G8929 Other chronic pain: Secondary | ICD-10-CM | POA: Diagnosis not present

## 2017-08-17 DIAGNOSIS — M5136 Other intervertebral disc degeneration, lumbar region: Secondary | ICD-10-CM | POA: Diagnosis not present

## 2017-08-17 DIAGNOSIS — I10 Essential (primary) hypertension: Secondary | ICD-10-CM | POA: Diagnosis not present

## 2017-08-17 DIAGNOSIS — E291 Testicular hypofunction: Secondary | ICD-10-CM | POA: Diagnosis not present

## 2017-08-17 DIAGNOSIS — M5030 Other cervical disc degeneration, unspecified cervical region: Secondary | ICD-10-CM | POA: Diagnosis not present

## 2017-08-31 DIAGNOSIS — E291 Testicular hypofunction: Secondary | ICD-10-CM | POA: Diagnosis not present

## 2017-08-31 DIAGNOSIS — Z6830 Body mass index (BMI) 30.0-30.9, adult: Secondary | ICD-10-CM | POA: Diagnosis not present

## 2017-08-31 DIAGNOSIS — Z9181 History of falling: Secondary | ICD-10-CM | POA: Diagnosis not present

## 2017-08-31 DIAGNOSIS — Z136 Encounter for screening for cardiovascular disorders: Secondary | ICD-10-CM | POA: Diagnosis not present

## 2017-08-31 DIAGNOSIS — E669 Obesity, unspecified: Secondary | ICD-10-CM | POA: Diagnosis not present

## 2017-08-31 DIAGNOSIS — Z125 Encounter for screening for malignant neoplasm of prostate: Secondary | ICD-10-CM | POA: Diagnosis not present

## 2017-08-31 DIAGNOSIS — Z1339 Encounter for screening examination for other mental health and behavioral disorders: Secondary | ICD-10-CM | POA: Diagnosis not present

## 2017-08-31 DIAGNOSIS — E785 Hyperlipidemia, unspecified: Secondary | ICD-10-CM | POA: Diagnosis not present

## 2017-08-31 DIAGNOSIS — Z1331 Encounter for screening for depression: Secondary | ICD-10-CM | POA: Diagnosis not present

## 2017-08-31 DIAGNOSIS — Z0000 Encounter for general adult medical examination without abnormal findings: Secondary | ICD-10-CM | POA: Diagnosis not present

## 2017-09-12 DIAGNOSIS — S2220XA Unspecified fracture of sternum, initial encounter for closed fracture: Secondary | ICD-10-CM | POA: Diagnosis not present

## 2017-09-12 DIAGNOSIS — M5137 Other intervertebral disc degeneration, lumbosacral region: Secondary | ICD-10-CM | POA: Diagnosis not present

## 2017-09-12 DIAGNOSIS — M5030 Other cervical disc degeneration, unspecified cervical region: Secondary | ICD-10-CM | POA: Diagnosis not present

## 2017-09-12 DIAGNOSIS — G8929 Other chronic pain: Secondary | ICD-10-CM | POA: Diagnosis not present

## 2017-09-12 DIAGNOSIS — E1140 Type 2 diabetes mellitus with diabetic neuropathy, unspecified: Secondary | ICD-10-CM | POA: Diagnosis not present

## 2017-09-28 DIAGNOSIS — E291 Testicular hypofunction: Secondary | ICD-10-CM | POA: Diagnosis not present

## 2017-10-12 DIAGNOSIS — E785 Hyperlipidemia, unspecified: Secondary | ICD-10-CM | POA: Diagnosis not present

## 2017-10-12 DIAGNOSIS — E1140 Type 2 diabetes mellitus with diabetic neuropathy, unspecified: Secondary | ICD-10-CM | POA: Diagnosis not present

## 2017-10-12 DIAGNOSIS — S2220XA Unspecified fracture of sternum, initial encounter for closed fracture: Secondary | ICD-10-CM | POA: Diagnosis not present

## 2017-10-12 DIAGNOSIS — Z79899 Other long term (current) drug therapy: Secondary | ICD-10-CM | POA: Diagnosis not present

## 2017-10-12 DIAGNOSIS — M5030 Other cervical disc degeneration, unspecified cervical region: Secondary | ICD-10-CM | POA: Diagnosis not present

## 2017-10-12 DIAGNOSIS — E291 Testicular hypofunction: Secondary | ICD-10-CM | POA: Diagnosis not present

## 2017-10-12 DIAGNOSIS — M5137 Other intervertebral disc degeneration, lumbosacral region: Secondary | ICD-10-CM | POA: Diagnosis not present

## 2017-10-12 DIAGNOSIS — G8929 Other chronic pain: Secondary | ICD-10-CM | POA: Diagnosis not present

## 2017-10-26 DIAGNOSIS — E291 Testicular hypofunction: Secondary | ICD-10-CM | POA: Diagnosis not present

## 2017-11-09 DIAGNOSIS — S2220XA Unspecified fracture of sternum, initial encounter for closed fracture: Secondary | ICD-10-CM | POA: Diagnosis not present

## 2017-11-09 DIAGNOSIS — M5137 Other intervertebral disc degeneration, lumbosacral region: Secondary | ICD-10-CM | POA: Diagnosis not present

## 2017-11-09 DIAGNOSIS — G8929 Other chronic pain: Secondary | ICD-10-CM | POA: Diagnosis not present

## 2017-11-09 DIAGNOSIS — E291 Testicular hypofunction: Secondary | ICD-10-CM | POA: Diagnosis not present

## 2017-11-09 DIAGNOSIS — M5030 Other cervical disc degeneration, unspecified cervical region: Secondary | ICD-10-CM | POA: Diagnosis not present

## 2017-11-23 DIAGNOSIS — N529 Male erectile dysfunction, unspecified: Secondary | ICD-10-CM | POA: Diagnosis not present

## 2017-12-07 DIAGNOSIS — S2220XA Unspecified fracture of sternum, initial encounter for closed fracture: Secondary | ICD-10-CM | POA: Diagnosis not present

## 2017-12-07 DIAGNOSIS — G8929 Other chronic pain: Secondary | ICD-10-CM | POA: Diagnosis not present

## 2017-12-07 DIAGNOSIS — E291 Testicular hypofunction: Secondary | ICD-10-CM | POA: Diagnosis not present

## 2017-12-07 DIAGNOSIS — M5137 Other intervertebral disc degeneration, lumbosacral region: Secondary | ICD-10-CM | POA: Diagnosis not present

## 2017-12-07 DIAGNOSIS — M5030 Other cervical disc degeneration, unspecified cervical region: Secondary | ICD-10-CM | POA: Diagnosis not present

## 2017-12-21 DIAGNOSIS — E291 Testicular hypofunction: Secondary | ICD-10-CM | POA: Diagnosis not present

## 2018-01-04 DIAGNOSIS — M5137 Other intervertebral disc degeneration, lumbosacral region: Secondary | ICD-10-CM | POA: Diagnosis not present

## 2018-01-04 DIAGNOSIS — I10 Essential (primary) hypertension: Secondary | ICD-10-CM | POA: Diagnosis not present

## 2018-01-04 DIAGNOSIS — S2220XA Unspecified fracture of sternum, initial encounter for closed fracture: Secondary | ICD-10-CM | POA: Diagnosis not present

## 2018-01-04 DIAGNOSIS — E291 Testicular hypofunction: Secondary | ICD-10-CM | POA: Diagnosis not present

## 2018-01-04 DIAGNOSIS — I2510 Atherosclerotic heart disease of native coronary artery without angina pectoris: Secondary | ICD-10-CM | POA: Diagnosis not present

## 2018-01-04 DIAGNOSIS — Z23 Encounter for immunization: Secondary | ICD-10-CM | POA: Diagnosis not present

## 2018-01-04 DIAGNOSIS — G8929 Other chronic pain: Secondary | ICD-10-CM | POA: Diagnosis not present

## 2018-01-05 DIAGNOSIS — Z733 Stress, not elsewhere classified: Secondary | ICD-10-CM | POA: Diagnosis not present

## 2018-01-05 DIAGNOSIS — F411 Generalized anxiety disorder: Secondary | ICD-10-CM | POA: Diagnosis not present

## 2018-01-05 DIAGNOSIS — Z79899 Other long term (current) drug therapy: Secondary | ICD-10-CM | POA: Diagnosis not present

## 2018-01-05 DIAGNOSIS — I10 Essential (primary) hypertension: Secondary | ICD-10-CM | POA: Diagnosis not present

## 2018-01-05 DIAGNOSIS — I4510 Unspecified right bundle-branch block: Secondary | ICD-10-CM | POA: Diagnosis not present

## 2018-01-05 DIAGNOSIS — R0602 Shortness of breath: Secondary | ICD-10-CM | POA: Diagnosis not present

## 2018-01-05 DIAGNOSIS — E119 Type 2 diabetes mellitus without complications: Secondary | ICD-10-CM | POA: Diagnosis not present

## 2018-01-05 DIAGNOSIS — R9431 Abnormal electrocardiogram [ECG] [EKG]: Secondary | ICD-10-CM | POA: Diagnosis not present

## 2018-01-05 DIAGNOSIS — I213 ST elevation (STEMI) myocardial infarction of unspecified site: Secondary | ICD-10-CM | POA: Diagnosis not present

## 2018-01-05 DIAGNOSIS — R079 Chest pain, unspecified: Secondary | ICD-10-CM | POA: Diagnosis not present

## 2018-01-05 DIAGNOSIS — R072 Precordial pain: Secondary | ICD-10-CM | POA: Diagnosis not present

## 2018-01-05 DIAGNOSIS — G894 Chronic pain syndrome: Secondary | ICD-10-CM | POA: Diagnosis not present

## 2018-01-05 DIAGNOSIS — I252 Old myocardial infarction: Secondary | ICD-10-CM | POA: Diagnosis not present

## 2018-01-05 DIAGNOSIS — Z87891 Personal history of nicotine dependence: Secondary | ICD-10-CM | POA: Diagnosis not present

## 2018-01-05 DIAGNOSIS — M549 Dorsalgia, unspecified: Secondary | ICD-10-CM | POA: Diagnosis not present

## 2018-01-05 DIAGNOSIS — R0789 Other chest pain: Secondary | ICD-10-CM | POA: Diagnosis not present

## 2018-01-05 DIAGNOSIS — F439 Reaction to severe stress, unspecified: Secondary | ICD-10-CM | POA: Diagnosis not present

## 2018-01-05 DIAGNOSIS — F410 Panic disorder [episodic paroxysmal anxiety] without agoraphobia: Secondary | ICD-10-CM | POA: Diagnosis not present

## 2018-01-05 DIAGNOSIS — I2510 Atherosclerotic heart disease of native coronary artery without angina pectoris: Secondary | ICD-10-CM | POA: Diagnosis not present

## 2018-01-05 DIAGNOSIS — Z794 Long term (current) use of insulin: Secondary | ICD-10-CM | POA: Diagnosis not present

## 2018-01-05 DIAGNOSIS — M25519 Pain in unspecified shoulder: Secondary | ICD-10-CM | POA: Diagnosis not present

## 2018-01-05 DIAGNOSIS — Z955 Presence of coronary angioplasty implant and graft: Secondary | ICD-10-CM | POA: Diagnosis not present

## 2018-01-05 DIAGNOSIS — Z951 Presence of aortocoronary bypass graft: Secondary | ICD-10-CM | POA: Diagnosis not present

## 2018-01-05 DIAGNOSIS — Z7982 Long term (current) use of aspirin: Secondary | ICD-10-CM | POA: Diagnosis not present

## 2018-01-06 DIAGNOSIS — I4510 Unspecified right bundle-branch block: Secondary | ICD-10-CM | POA: Diagnosis not present

## 2018-01-06 DIAGNOSIS — Z794 Long term (current) use of insulin: Secondary | ICD-10-CM | POA: Diagnosis not present

## 2018-01-06 DIAGNOSIS — R0789 Other chest pain: Secondary | ICD-10-CM | POA: Diagnosis not present

## 2018-01-06 DIAGNOSIS — G894 Chronic pain syndrome: Secondary | ICD-10-CM | POA: Diagnosis not present

## 2018-01-06 DIAGNOSIS — Z955 Presence of coronary angioplasty implant and graft: Secondary | ICD-10-CM | POA: Diagnosis not present

## 2018-01-06 DIAGNOSIS — E119 Type 2 diabetes mellitus without complications: Secondary | ICD-10-CM | POA: Diagnosis not present

## 2018-01-06 DIAGNOSIS — R9431 Abnormal electrocardiogram [ECG] [EKG]: Secondary | ICD-10-CM | POA: Diagnosis not present

## 2018-01-06 DIAGNOSIS — I517 Cardiomegaly: Secondary | ICD-10-CM | POA: Diagnosis not present

## 2018-01-06 DIAGNOSIS — I2510 Atherosclerotic heart disease of native coronary artery without angina pectoris: Secondary | ICD-10-CM | POA: Diagnosis not present

## 2018-01-06 DIAGNOSIS — Z87891 Personal history of nicotine dependence: Secondary | ICD-10-CM | POA: Diagnosis not present

## 2018-01-06 DIAGNOSIS — I10 Essential (primary) hypertension: Secondary | ICD-10-CM | POA: Diagnosis not present

## 2018-01-06 DIAGNOSIS — J984 Other disorders of lung: Secondary | ICD-10-CM | POA: Diagnosis not present

## 2018-01-06 DIAGNOSIS — Z951 Presence of aortocoronary bypass graft: Secondary | ICD-10-CM | POA: Diagnosis not present

## 2018-01-06 DIAGNOSIS — I252 Old myocardial infarction: Secondary | ICD-10-CM | POA: Diagnosis not present

## 2018-01-06 DIAGNOSIS — F411 Generalized anxiety disorder: Secondary | ICD-10-CM | POA: Diagnosis not present

## 2018-01-09 DIAGNOSIS — J019 Acute sinusitis, unspecified: Secondary | ICD-10-CM | POA: Diagnosis not present

## 2018-01-09 DIAGNOSIS — J208 Acute bronchitis due to other specified organisms: Secondary | ICD-10-CM | POA: Diagnosis not present

## 2018-01-18 DIAGNOSIS — E291 Testicular hypofunction: Secondary | ICD-10-CM | POA: Diagnosis not present

## 2018-01-30 DIAGNOSIS — H2513 Age-related nuclear cataract, bilateral: Secondary | ICD-10-CM | POA: Diagnosis not present

## 2018-01-30 DIAGNOSIS — E119 Type 2 diabetes mellitus without complications: Secondary | ICD-10-CM | POA: Diagnosis not present

## 2018-02-01 DIAGNOSIS — G8929 Other chronic pain: Secondary | ICD-10-CM | POA: Diagnosis not present

## 2018-02-01 DIAGNOSIS — E291 Testicular hypofunction: Secondary | ICD-10-CM | POA: Diagnosis not present

## 2018-02-01 DIAGNOSIS — S2220XA Unspecified fracture of sternum, initial encounter for closed fracture: Secondary | ICD-10-CM | POA: Diagnosis not present

## 2018-02-01 DIAGNOSIS — I10 Essential (primary) hypertension: Secondary | ICD-10-CM | POA: Diagnosis not present

## 2018-02-01 DIAGNOSIS — E1140 Type 2 diabetes mellitus with diabetic neuropathy, unspecified: Secondary | ICD-10-CM | POA: Diagnosis not present

## 2018-02-01 DIAGNOSIS — M5137 Other intervertebral disc degeneration, lumbosacral region: Secondary | ICD-10-CM | POA: Diagnosis not present

## 2018-02-08 DIAGNOSIS — I252 Old myocardial infarction: Secondary | ICD-10-CM | POA: Diagnosis not present

## 2018-02-08 DIAGNOSIS — Z955 Presence of coronary angioplasty implant and graft: Secondary | ICD-10-CM | POA: Diagnosis not present

## 2018-02-08 DIAGNOSIS — E785 Hyperlipidemia, unspecified: Secondary | ICD-10-CM | POA: Diagnosis not present

## 2018-02-08 DIAGNOSIS — I2510 Atherosclerotic heart disease of native coronary artery without angina pectoris: Secondary | ICD-10-CM | POA: Diagnosis not present

## 2018-02-08 DIAGNOSIS — Z951 Presence of aortocoronary bypass graft: Secondary | ICD-10-CM | POA: Diagnosis not present

## 2018-02-08 DIAGNOSIS — I10 Essential (primary) hypertension: Secondary | ICD-10-CM | POA: Diagnosis not present

## 2018-02-09 ENCOUNTER — Encounter: Payer: Self-pay | Admitting: Podiatry

## 2018-02-09 ENCOUNTER — Other Ambulatory Visit: Payer: Self-pay

## 2018-02-09 ENCOUNTER — Ambulatory Visit (INDEPENDENT_AMBULATORY_CARE_PROVIDER_SITE_OTHER): Payer: Medicare Other | Admitting: Podiatry

## 2018-02-09 VITALS — BP 154/97 | HR 90 | Resp 16 | Ht 68.0 in | Wt 197.0 lb

## 2018-02-09 DIAGNOSIS — M2042 Other hammer toe(s) (acquired), left foot: Secondary | ICD-10-CM | POA: Diagnosis not present

## 2018-02-09 DIAGNOSIS — E119 Type 2 diabetes mellitus without complications: Secondary | ICD-10-CM | POA: Diagnosis not present

## 2018-02-09 DIAGNOSIS — E1142 Type 2 diabetes mellitus with diabetic polyneuropathy: Secondary | ICD-10-CM

## 2018-02-09 DIAGNOSIS — M21622 Bunionette of left foot: Secondary | ICD-10-CM

## 2018-02-09 DIAGNOSIS — B351 Tinea unguium: Secondary | ICD-10-CM | POA: Diagnosis not present

## 2018-02-09 DIAGNOSIS — M2041 Other hammer toe(s) (acquired), right foot: Secondary | ICD-10-CM | POA: Diagnosis not present

## 2018-02-09 DIAGNOSIS — L600 Ingrowing nail: Secondary | ICD-10-CM | POA: Diagnosis not present

## 2018-02-09 DIAGNOSIS — M21621 Bunionette of right foot: Secondary | ICD-10-CM | POA: Diagnosis not present

## 2018-02-09 MED ORDER — NEOMYCIN-POLYMYXIN-HC 3.5-10000-1 OT SOLN: OTIC | 0 refills | Status: DC

## 2018-02-09 NOTE — Patient Instructions (Addendum)
Place 1/4 cup of epsom salts in a quart of warm tap water.  Submerge your foot or feet in the solution and soak for 20 minutes.  This soak should be done twice a day.  Next, remove your foot or feet from solution, blot dry the affected area. Apply ointment and cover if instructed by your doctor.   IF YOUR SKIN BECOMES IRRITATED WHILE USING THESE INSTRUCTIONS, IT IS OKAY TO SWITCH TO  WHITE VINEGAR AND WATER.  As another alternative soak, you may use antibacterial soap and water.  Monitor for any signs/symptoms of infection. Call the office immediately if any occur or go directly to the emergency room. Call with any questions/concerns.  [place] Instructions-Post Nail Surgery  You have had your ingrown toenail and root treated with a chemical.  This chemical causes a burn that will drain and ooze like a blister.  This can drain for 6-8 weeks or longer.  It is important to keep this area clean, covered, and follow the soaking instructions dispensed at the time of your surgery.  This area will eventually dry and form a scab.  Once the scab forms you no longer need to soak or apply a dressing.  If at any time you experience an increase in pain, redness, swelling, or drainage, you should contact the office as soon as possible.  ---  Diabetes and Foot Care Diabetes may cause you to have problems because of poor blood supply (circulation) to your feet and legs. This may cause the skin on your feet to become thinner, break easier, and heal more slowly. Your skin may become dry, and the skin may peel and crack. You may also have nerve damage in your legs and feet causing decreased feeling in them. You may not notice minor injuries to your feet that could lead to infections or more serious problems. Taking care of your feet is one of the most important things you can do for yourself. Follow these instructions at home:  Wear shoes at all times, even in the house. Do not go barefoot. Bare feet are easily  injured.  Check your feet daily for blisters, cuts, and redness. If you cannot see the bottom of your feet, use a mirror or ask someone for help.  Wash your feet with warm water (do not use hot water) and mild soap. Then pat your feet and the areas between your toes until they are completely dry. Do not soak your feet as this can dry your skin.  Apply a moisturizing lotion or petroleum jelly (that does not contain alcohol and is unscented) to the skin on your feet and to dry, brittle toenails. Do not apply lotion between your toes.  Trim your toenails straight across. Do not dig under them or around the cuticle. File the edges of your nails with an emery board or nail file.  Do not cut corns or calluses or try to remove them with medicine.  Wear clean socks or stockings every day. Make sure they are not too tight. Do not wear knee-high stockings since they may decrease blood flow to your legs.  Wear shoes that fit properly and have enough cushioning. To break in new shoes, wear them for just a few hours a day. This prevents you from injuring your feet. Always look in your shoes before you put them on to be sure there are no objects inside.  Do not cross your legs. This may decrease the blood flow to your feet.  If you find a minor scrape, cut, or break in the skin on your feet, keep it and the skin around it clean and dry. These areas may be cleansed with mild soap and water. Do not cleanse the area with peroxide, alcohol, or iodine.  When you remove an adhesive bandage, be sure not to damage the skin around it.  If you have a wound, look at it several times a day to make sure it is healing.  Do not use heating pads or hot water bottles. They may burn your skin. If you have lost feeling in your feet or legs, you may not know it is happening until it is too late.  Make sure your health care provider performs a complete foot exam at least annually or more often if you have foot problems.  Report any cuts, sores, or bruises to your health care provider immediately. Contact a health care provider if:  You have an injury that is not healing.  You have cuts or breaks in the skin.  You have an ingrown nail.  You notice redness on your legs or feet.  You feel burning or tingling in your legs or feet.  You have pain or cramps in your legs and feet.  Your legs or feet are numb.  Your feet always feel cold. Get help right away if:  There is increasing redness, swelling, or pain in or around a wound.  There is a red line that goes up your leg.  Pus is coming from a wound.  You develop a fever or as directed by your health care provider.  You notice a bad smell coming from an ulcer or wound. This information is not intended to replace advice given to you by your health care provider. Make sure you discuss any questions you have with your health care provider. Document Released: 02/29/2000 Document Revised: 08/09/2015 Document Reviewed: 08/10/2012 Elsevier Interactive Patient Education  2017 [employer].

## 2018-02-09 NOTE — Progress Notes (Signed)
   Subjective:    Patient ID: Alejandro Taylor, male    DOB: 04-04-1960, 57 y.o.   MRN: [id number]  HPI    Review of Systems  All other systems reviewed and are negative.      Objective:   Physical Exam        Assessment & Plan:

## 2018-02-09 NOTE — Progress Notes (Signed)
Subjective:  Patient ID: Alejandro Taylor, male    DOB: [birth year],  MRN: [id number]  Chief Complaint  Patient presents with  . debride    BL nail trimming -FBS: 174 A1V: 8 PCP: Shutlx x 2 wks  . shoes    Pt interested in Diabetic shoes  . Nail Problem    R hallux medial border x 2 wks; very painful -pt denies N/v/F?CH/redness/swelling/drainage Tx: trimming -pt states," it feel like I have an ingrown it's very sore."    57 y.o. male presents  for diabetic foot care. Last AMBS was 174.Denies numbness and tingling in their feet. States he used to have worse neuropathy but controlling his diet helps. Denies cramping in legs and thighs.  Complains of painful ingrown nails to both great toes worse along right that he tries to cut out himself.  Has difficulty getting certain shoes to fit without pain due to his tailor's bunion.  Review of Systems: Negative except as noted in the HPI. Denies N/V/F/Ch.  Past Medical History:  Diagnosis Date  . Anginal pain ([place])   . Anxiety   . Arthritis   . CHF (congestive heart failure) ([place])   . Depression   . Diabetes mellitus without complication ([place])    Type II  . GERD (gastroesophageal reflux disease)   . Hypertension   . Myocardial infarction ([place])    x2  . On home oxygen therapy    only as needed for sleep apnea  . Pneumonia   . Rotator cuff injury, left, initial encounter   . Sleep apnea     Current Outpatient Medications:  .  ALPRAZolam (XANAX) 0.5 MG tablet, Take 0.125-0.25 mg by mouth 2 (two) times daily as needed for anxiety., Disp: , Rfl:  .  aspirin EC 81 MG tablet, Take 81 mg by mouth at bedtime., Disp: , Rfl:  .  carvedilol (COREG) 12.5 MG tablet, , Disp: , Rfl:  .  clindamycin (CLINDAGEL) 1 % gel, Apply 1 application topically 2 (two) times daily., Disp: , Rfl:  .  colesevelam (WELCHOL) 625 MG tablet, Take 1,875 mg by mouth 2 (two) times daily., Disp: , Rfl:  .  furosemide (LASIX) 40 MG tablet, Take 20 mg by mouth daily.,  Disp: , Rfl:  .  insulin glargine (LANTUS) 100 UNIT/ML injection, Inject 35 Units into the skin every evening., Disp: , Rfl:  .  insulin lispro (HUMALOG) 100 UNIT/ML injection, Inject 2-3 Units into the skin every evening., Disp: , Rfl:  .  ipratropium-albuterol (DUONEB) 0.5-2.5 (3) MG/3ML SOLN, Inhale 3 mLs into the lungs 4 (four) times daily as needed for shortness of breath., Disp: , Rfl:  .  lisinopril (PRINIVIL,ZESTRIL) 10 MG tablet, , Disp: , Rfl:  .  nystatin cream (MYCOSTATIN), Apply 1 application topically daily as needed (skin flares). , Disp: , Rfl:  .  omeprazole (PRILOSEC) 40 MG capsule, Take 40 mg by mouth daily., Disp: , Rfl:  .  oxycodone (ROXICODONE) 30 MG immediate release tablet, Take 60 mg by mouth every 4 (four) hours., Disp: , Rfl:  .  OXYCONTIN 80 MG 12 hr tablet, Take 40 mg by mouth 2 (two) times daily as needed (pain). , Disp: , Rfl:  .  PARoxetine (PAXIL) 20 MG tablet, , Disp: , Rfl:  .  PROAIR HFA 108 (90 Base) MCG/ACT inhaler, Inhale 2 puffs into the lungs every 6 (six) hours as needed for shortness of breath., Disp: , Rfl:  .  psyllium (METAMUCIL) 0.52 g capsule, Take  0.52 g by mouth 2 (two) times daily., Disp: , Rfl:  .  tamsulosin (FLOMAX) 0.4 MG CAPS capsule, Take 0.4 mg by mouth daily., Disp: , Rfl:  .  testosterone cypionate (DEPOTESTOSTERONE CYPIONATE) 200 MG/ML injection, Inject 1 dose every 30 days, Disp: , Rfl:  .  Ticagrelor (BRILINTA PO), Take 45 mg by mouth 2 (two) times daily., Disp: , Rfl:   Social History   Tobacco Use  Smoking Status Former Smoker  . Types: Cigarettes  Smokeless Tobacco Current User  . Types: Chew    Allergies  Allergen Reactions  . Benicar [Olmesartan]     Caused issues with gallbladder   . Ezetimibe Other (See Comments)    Muscle weakness.  . Latex Itching and Swelling  . Moxifloxacin     Unknown   . Prednisone Swelling    Not allergic to prednisolone   . Rosuvastatin     Leg cramps    Objective:   Vitals:    02/09/18 0931  BP: (!) 154/97  Pulse: 90  Resp: 16   Body mass index is 29.95 kg/m. Constitutional Well developed. Well nourished.  Vascular Dorsalis pedis pulses present 2+ bilaterally  Posterior tibial pulses present 1+ bilaterally  Pedal hair growth normal. Capillary refill normal to all digits.  No cyanosis or clubbing noted.  Neurologic Normal speech. Oriented to person, place, and time. Epicritic sensation to light touch grossly present bilaterally. Protective sensation with 5.07 monofilament  absent bilaterally. Vibratory sensation present bilaterally.  Dermatologic Painfully ingrowing nail to the right hallux medial border, tender to palpation left hallux medial border No open wounds. No skin lesions.  Orthopedic: Normal joint ROM without pain or crepitus bilaterally. Hammertoe deformities bilateral, tailor's bunion deformity bilateral No bony tenderness.   Assessment:   1. Encounter for diabetic foot exam ([place])   2. DM type 2 with diabetic peripheral neuropathy ([place])   3. Onychomycosis   4. Ingrown nail   5. Hammer toes of both feet   6. Tailor's bunion of both feet    Plan:  Patient was evaluated and treated and all questions answered.  Diabetes with DPN, Onychomycosis -Educated on diabetic footcare. Diabetic risk level 2 -Rx for DM shoes written  Ingrown Nail, right -Patient elects to proceed with ingrown toenail removal today -Ingrown nail excised. See procedure note. -Educated on post-procedure care including soaking. Written instructions provided. -Patient to follow up in 2 weeks for nail check.  Procedure: Excision of Ingrown Toenail Location: Right 1st medial nail borders. Anesthesia: Lidocaine 1% plain; 1.76mL and Marcaine 0.5% plain; 1.63mL, digital block. Skin Prep: Alcohol. Dressing: Silvadene; telfa; dry, sterile, compression dressing. Technique: Following skin prep, the toe was exsanguinated and a tourniquet was secured at the base of the toe.  The affected nail border was freed, split with a nail splitter, and excised. Chemical matrixectomy was then performed with phenol and irrigated out with alcohol. The tourniquet was then removed and sterile dressing applied. Disposition: Patient tolerated procedure well. Patient to return in 2 weeks for follow-up.   No follow-ups on file.

## 2018-02-15 DIAGNOSIS — E291 Testicular hypofunction: Secondary | ICD-10-CM | POA: Diagnosis not present

## 2018-02-18 ENCOUNTER — Ambulatory Visit: Payer: Medicare Other | Admitting: *Deleted

## 2018-02-23 ENCOUNTER — Ambulatory Visit (INDEPENDENT_AMBULATORY_CARE_PROVIDER_SITE_OTHER): Payer: Medicare Other | Admitting: Podiatry

## 2018-02-23 DIAGNOSIS — M79676 Pain in unspecified toe(s): Secondary | ICD-10-CM | POA: Diagnosis not present

## 2018-02-23 DIAGNOSIS — L600 Ingrowing nail: Secondary | ICD-10-CM | POA: Diagnosis not present

## 2018-02-23 NOTE — Progress Notes (Signed)
  Subjective:  Patient ID: Alejandro Taylor, male    DOB: 04-24-1960,  MRN: [id number]  Chief Complaint  Patient presents with  . nail check    R hallux nail check Pt. stated," healing up good, just a little sore; 2/10." Tx: epsm and corticosporin -pt denies N/.V/F?Ch/drainage/swelling/redness   57 y.o. male returns for the above complaint.   Objective:   General AA&O x3. Normal mood and affect.  Vascular Foot warm and well perfused with good capillary refill.  Neurologic Sensation grossly intact.  Dermatologic Nail avulsion site healing well without drainage or erythema. Nail bed with overlying soft crust. Left intact. No signs of local infection. L Hallux medial border slight incurvated nail  Orthopedic: No tenderness to palpation of the toe. No TTP Left hallux    Assessment & Plan:  Patient was evaluated and treated and all questions answered.  S/p Ingrown Toenail Excision, right -Healing well without issue. -Discussed return precautions. -F/u PRN  Ingrown Nail Left -Not hurting today, advised to call when it does hurt and will plan for removal.

## 2018-03-01 DIAGNOSIS — M5137 Other intervertebral disc degeneration, lumbosacral region: Secondary | ICD-10-CM | POA: Diagnosis not present

## 2018-03-01 DIAGNOSIS — G8929 Other chronic pain: Secondary | ICD-10-CM | POA: Diagnosis not present

## 2018-03-01 DIAGNOSIS — Z79899 Other long term (current) drug therapy: Secondary | ICD-10-CM | POA: Diagnosis not present

## 2018-03-01 DIAGNOSIS — E1165 Type 2 diabetes mellitus with hyperglycemia: Secondary | ICD-10-CM | POA: Diagnosis not present

## 2018-03-01 DIAGNOSIS — E291 Testicular hypofunction: Secondary | ICD-10-CM | POA: Diagnosis not present

## 2018-03-01 DIAGNOSIS — I10 Essential (primary) hypertension: Secondary | ICD-10-CM | POA: Diagnosis not present

## 2018-03-01 DIAGNOSIS — E1140 Type 2 diabetes mellitus with diabetic neuropathy, unspecified: Secondary | ICD-10-CM | POA: Diagnosis not present

## 2018-03-01 DIAGNOSIS — S2220XA Unspecified fracture of sternum, initial encounter for closed fracture: Secondary | ICD-10-CM | POA: Diagnosis not present

## 2018-03-01 DIAGNOSIS — E785 Hyperlipidemia, unspecified: Secondary | ICD-10-CM | POA: Diagnosis not present

## 2018-03-15 DIAGNOSIS — E291 Testicular hypofunction: Secondary | ICD-10-CM | POA: Diagnosis not present

## 2018-03-29 DIAGNOSIS — G8929 Other chronic pain: Secondary | ICD-10-CM | POA: Diagnosis not present

## 2018-03-29 DIAGNOSIS — M5137 Other intervertebral disc degeneration, lumbosacral region: Secondary | ICD-10-CM | POA: Diagnosis not present

## 2018-03-29 DIAGNOSIS — E291 Testicular hypofunction: Secondary | ICD-10-CM | POA: Diagnosis not present

## 2018-03-29 DIAGNOSIS — I10 Essential (primary) hypertension: Secondary | ICD-10-CM | POA: Diagnosis not present

## 2018-03-29 DIAGNOSIS — S2220XA Unspecified fracture of sternum, initial encounter for closed fracture: Secondary | ICD-10-CM | POA: Diagnosis not present

## 2018-04-07 ENCOUNTER — Telehealth: Payer: Self-pay | Admitting: Podiatry

## 2018-04-07 NOTE — Telephone Encounter (Signed)
Pt called and left message on 1.20.2020 asking about the status of his diabetic shoes.

## 2018-04-08 ENCOUNTER — Telehealth: Payer: Self-pay | Admitting: Podiatry

## 2018-04-08 NOTE — Telephone Encounter (Signed)
Pt left a message again asking for a call back to update him on his diabetic shoes. He was measured in December and has not heard anything. Please call pt and let him know.

## 2018-04-12 NOTE — Telephone Encounter (Signed)
LM informing patient that paperwork has been approved we are just waiting on the shoes and inserts to arrive we will call when they come in

## 2018-04-13 DIAGNOSIS — E291 Testicular hypofunction: Secondary | ICD-10-CM | POA: Diagnosis not present

## 2018-04-26 DIAGNOSIS — I10 Essential (primary) hypertension: Secondary | ICD-10-CM | POA: Diagnosis not present

## 2018-04-26 DIAGNOSIS — M5137 Other intervertebral disc degeneration, lumbosacral region: Secondary | ICD-10-CM | POA: Diagnosis not present

## 2018-04-26 DIAGNOSIS — S2220XA Unspecified fracture of sternum, initial encounter for closed fracture: Secondary | ICD-10-CM | POA: Diagnosis not present

## 2018-04-26 DIAGNOSIS — G8929 Other chronic pain: Secondary | ICD-10-CM | POA: Diagnosis not present

## 2018-04-26 DIAGNOSIS — E291 Testicular hypofunction: Secondary | ICD-10-CM | POA: Diagnosis not present

## 2018-05-10 ENCOUNTER — Ambulatory Visit: Payer: Medicare Other

## 2018-05-10 DIAGNOSIS — M21621 Bunionette of right foot: Secondary | ICD-10-CM

## 2018-05-10 DIAGNOSIS — I4519 Other right bundle-branch block: Secondary | ICD-10-CM | POA: Diagnosis not present

## 2018-05-10 DIAGNOSIS — K5730 Diverticulosis of large intestine without perforation or abscess without bleeding: Secondary | ICD-10-CM | POA: Diagnosis not present

## 2018-05-10 DIAGNOSIS — M5489 Other dorsalgia: Secondary | ICD-10-CM | POA: Diagnosis not present

## 2018-05-10 DIAGNOSIS — E291 Testicular hypofunction: Secondary | ICD-10-CM | POA: Diagnosis not present

## 2018-05-10 DIAGNOSIS — M479 Spondylosis, unspecified: Secondary | ICD-10-CM | POA: Diagnosis not present

## 2018-05-10 DIAGNOSIS — Z794 Long term (current) use of insulin: Secondary | ICD-10-CM | POA: Diagnosis not present

## 2018-05-10 DIAGNOSIS — F329 Major depressive disorder, single episode, unspecified: Secondary | ICD-10-CM | POA: Diagnosis not present

## 2018-05-10 DIAGNOSIS — Z79899 Other long term (current) drug therapy: Secondary | ICD-10-CM | POA: Diagnosis not present

## 2018-05-10 DIAGNOSIS — R072 Precordial pain: Secondary | ICD-10-CM | POA: Diagnosis not present

## 2018-05-10 DIAGNOSIS — I110 Hypertensive heart disease with heart failure: Secondary | ICD-10-CM | POA: Diagnosis not present

## 2018-05-10 DIAGNOSIS — Z7982 Long term (current) use of aspirin: Secondary | ICD-10-CM | POA: Diagnosis not present

## 2018-05-10 DIAGNOSIS — I2510 Atherosclerotic heart disease of native coronary artery without angina pectoris: Secondary | ICD-10-CM | POA: Diagnosis not present

## 2018-05-10 DIAGNOSIS — Z951 Presence of aortocoronary bypass graft: Secondary | ICD-10-CM | POA: Diagnosis not present

## 2018-05-10 DIAGNOSIS — R52 Pain, unspecified: Secondary | ICD-10-CM | POA: Diagnosis not present

## 2018-05-10 DIAGNOSIS — I213 ST elevation (STEMI) myocardial infarction of unspecified site: Secondary | ICD-10-CM | POA: Diagnosis not present

## 2018-05-10 DIAGNOSIS — R0902 Hypoxemia: Secondary | ICD-10-CM | POA: Diagnosis not present

## 2018-05-10 DIAGNOSIS — Z87891 Personal history of nicotine dependence: Secondary | ICD-10-CM | POA: Diagnosis not present

## 2018-05-10 DIAGNOSIS — E785 Hyperlipidemia, unspecified: Secondary | ICD-10-CM | POA: Diagnosis not present

## 2018-05-10 DIAGNOSIS — I214 Non-ST elevation (NSTEMI) myocardial infarction: Secondary | ICD-10-CM | POA: Diagnosis not present

## 2018-05-10 DIAGNOSIS — E1142 Type 2 diabetes mellitus with diabetic polyneuropathy: Secondary | ICD-10-CM

## 2018-05-10 DIAGNOSIS — R079 Chest pain, unspecified: Secondary | ICD-10-CM | POA: Diagnosis not present

## 2018-05-10 DIAGNOSIS — I249 Acute ischemic heart disease, unspecified: Secondary | ICD-10-CM | POA: Diagnosis not present

## 2018-05-10 DIAGNOSIS — M2041 Other hammer toe(s) (acquired), right foot: Secondary | ICD-10-CM

## 2018-05-10 DIAGNOSIS — M21622 Bunionette of left foot: Secondary | ICD-10-CM

## 2018-05-10 DIAGNOSIS — I2119 ST elevation (STEMI) myocardial infarction involving other coronary artery of inferior wall: Secondary | ICD-10-CM | POA: Diagnosis not present

## 2018-05-10 DIAGNOSIS — I255 Ischemic cardiomyopathy: Secondary | ICD-10-CM | POA: Diagnosis not present

## 2018-05-10 DIAGNOSIS — Z955 Presence of coronary angioplasty implant and graft: Secondary | ICD-10-CM | POA: Diagnosis not present

## 2018-05-10 DIAGNOSIS — I509 Heart failure, unspecified: Secondary | ICD-10-CM | POA: Diagnosis not present

## 2018-05-10 DIAGNOSIS — G8929 Other chronic pain: Secondary | ICD-10-CM | POA: Diagnosis not present

## 2018-05-10 DIAGNOSIS — M2042 Other hammer toe(s) (acquired), left foot: Secondary | ICD-10-CM

## 2018-05-10 DIAGNOSIS — F17220 Nicotine dependence, chewing tobacco, uncomplicated: Secondary | ICD-10-CM | POA: Diagnosis not present

## 2018-05-10 DIAGNOSIS — F411 Generalized anxiety disorder: Secondary | ICD-10-CM | POA: Diagnosis not present

## 2018-05-10 DIAGNOSIS — I10 Essential (primary) hypertension: Secondary | ICD-10-CM | POA: Diagnosis not present

## 2018-05-10 DIAGNOSIS — R7989 Other specified abnormal findings of blood chemistry: Secondary | ICD-10-CM | POA: Diagnosis not present

## 2018-05-10 DIAGNOSIS — E119 Type 2 diabetes mellitus without complications: Secondary | ICD-10-CM | POA: Diagnosis not present

## 2018-05-10 DIAGNOSIS — G894 Chronic pain syndrome: Secondary | ICD-10-CM | POA: Diagnosis not present

## 2018-05-10 NOTE — Patient Instructions (Signed)

## 2018-05-10 NOTE — Progress Notes (Incomplete)
Patient ID: Alejandro Taylor, male   DOB: 1960/06/02, 58 y.o.   MRN: [id number]

## 2018-05-11 DIAGNOSIS — G8929 Other chronic pain: Secondary | ICD-10-CM | POA: Diagnosis not present

## 2018-05-11 DIAGNOSIS — I252 Old myocardial infarction: Secondary | ICD-10-CM | POA: Diagnosis not present

## 2018-05-11 DIAGNOSIS — I2510 Atherosclerotic heart disease of native coronary artery without angina pectoris: Secondary | ICD-10-CM | POA: Diagnosis not present

## 2018-05-11 DIAGNOSIS — I2582 Chronic total occlusion of coronary artery: Secondary | ICD-10-CM | POA: Diagnosis not present

## 2018-05-11 DIAGNOSIS — E785 Hyperlipidemia, unspecified: Secondary | ICD-10-CM | POA: Insufficient documentation

## 2018-05-11 DIAGNOSIS — I440 Atrioventricular block, first degree: Secondary | ICD-10-CM | POA: Diagnosis not present

## 2018-05-11 DIAGNOSIS — I2119 ST elevation (STEMI) myocardial infarction involving other coronary artery of inferior wall: Secondary | ICD-10-CM | POA: Diagnosis not present

## 2018-05-11 DIAGNOSIS — M549 Dorsalgia, unspecified: Secondary | ICD-10-CM | POA: Diagnosis not present

## 2018-05-11 DIAGNOSIS — I214 Non-ST elevation (NSTEMI) myocardial infarction: Secondary | ICD-10-CM | POA: Diagnosis not present

## 2018-05-11 DIAGNOSIS — R0789 Other chest pain: Secondary | ICD-10-CM | POA: Diagnosis not present

## 2018-05-11 DIAGNOSIS — I10 Essential (primary) hypertension: Secondary | ICD-10-CM | POA: Diagnosis not present

## 2018-05-11 DIAGNOSIS — I4510 Unspecified right bundle-branch block: Secondary | ICD-10-CM | POA: Diagnosis not present

## 2018-05-11 DIAGNOSIS — Z951 Presence of aortocoronary bypass graft: Secondary | ICD-10-CM | POA: Diagnosis not present

## 2018-05-11 DIAGNOSIS — Z955 Presence of coronary angioplasty implant and graft: Secondary | ICD-10-CM | POA: Diagnosis not present

## 2018-05-11 DIAGNOSIS — R079 Chest pain, unspecified: Secondary | ICD-10-CM | POA: Diagnosis not present

## 2018-05-11 DIAGNOSIS — F411 Generalized anxiety disorder: Secondary | ICD-10-CM | POA: Diagnosis not present

## 2018-05-11 DIAGNOSIS — E7849 Other hyperlipidemia: Secondary | ICD-10-CM | POA: Diagnosis not present

## 2018-05-12 DIAGNOSIS — E119 Type 2 diabetes mellitus without complications: Secondary | ICD-10-CM | POA: Diagnosis not present

## 2018-05-12 DIAGNOSIS — Z955 Presence of coronary angioplasty implant and graft: Secondary | ICD-10-CM | POA: Diagnosis not present

## 2018-05-12 DIAGNOSIS — Z951 Presence of aortocoronary bypass graft: Secondary | ICD-10-CM | POA: Diagnosis not present

## 2018-05-12 DIAGNOSIS — Z6831 Body mass index (BMI) 31.0-31.9, adult: Secondary | ICD-10-CM | POA: Diagnosis not present

## 2018-05-12 DIAGNOSIS — Z79891 Long term (current) use of opiate analgesic: Secondary | ICD-10-CM | POA: Diagnosis not present

## 2018-05-12 DIAGNOSIS — I214 Non-ST elevation (NSTEMI) myocardial infarction: Secondary | ICD-10-CM | POA: Diagnosis not present

## 2018-05-12 DIAGNOSIS — E7849 Other hyperlipidemia: Secondary | ICD-10-CM | POA: Diagnosis not present

## 2018-05-12 DIAGNOSIS — M549 Dorsalgia, unspecified: Secondary | ICD-10-CM | POA: Diagnosis not present

## 2018-05-12 DIAGNOSIS — I2510 Atherosclerotic heart disease of native coronary artery without angina pectoris: Secondary | ICD-10-CM | POA: Diagnosis not present

## 2018-05-12 DIAGNOSIS — G8929 Other chronic pain: Secondary | ICD-10-CM | POA: Diagnosis not present

## 2018-05-12 DIAGNOSIS — I501 Left ventricular failure: Secondary | ICD-10-CM | POA: Diagnosis not present

## 2018-05-21 DIAGNOSIS — N23 Unspecified renal colic: Secondary | ICD-10-CM | POA: Diagnosis not present

## 2018-05-21 DIAGNOSIS — S2220XA Unspecified fracture of sternum, initial encounter for closed fracture: Secondary | ICD-10-CM | POA: Diagnosis not present

## 2018-05-21 DIAGNOSIS — G8929 Other chronic pain: Secondary | ICD-10-CM | POA: Diagnosis not present

## 2018-05-21 DIAGNOSIS — N390 Urinary tract infection, site not specified: Secondary | ICD-10-CM | POA: Diagnosis not present

## 2018-05-24 DIAGNOSIS — R311 Benign essential microscopic hematuria: Secondary | ICD-10-CM | POA: Diagnosis not present

## 2018-05-24 DIAGNOSIS — N3001 Acute cystitis with hematuria: Secondary | ICD-10-CM | POA: Diagnosis not present

## 2018-06-04 DIAGNOSIS — E1140 Type 2 diabetes mellitus with diabetic neuropathy, unspecified: Secondary | ICD-10-CM | POA: Diagnosis not present

## 2018-06-07 DIAGNOSIS — E291 Testicular hypofunction: Secondary | ICD-10-CM | POA: Diagnosis not present

## 2018-06-09 DIAGNOSIS — I10 Essential (primary) hypertension: Secondary | ICD-10-CM | POA: Diagnosis not present

## 2018-06-09 DIAGNOSIS — Z955 Presence of coronary angioplasty implant and graft: Secondary | ICD-10-CM | POA: Diagnosis not present

## 2018-06-09 DIAGNOSIS — Z951 Presence of aortocoronary bypass graft: Secondary | ICD-10-CM | POA: Diagnosis not present

## 2018-06-09 DIAGNOSIS — I2510 Atherosclerotic heart disease of native coronary artery without angina pectoris: Secondary | ICD-10-CM | POA: Diagnosis not present

## 2018-06-09 DIAGNOSIS — I420 Dilated cardiomyopathy: Secondary | ICD-10-CM | POA: Diagnosis not present

## 2018-06-09 DIAGNOSIS — E785 Hyperlipidemia, unspecified: Secondary | ICD-10-CM | POA: Diagnosis not present

## 2018-06-09 DIAGNOSIS — I255 Ischemic cardiomyopathy: Secondary | ICD-10-CM | POA: Diagnosis not present

## 2018-06-21 DIAGNOSIS — G8929 Other chronic pain: Secondary | ICD-10-CM | POA: Diagnosis not present

## 2018-06-21 DIAGNOSIS — I10 Essential (primary) hypertension: Secondary | ICD-10-CM | POA: Diagnosis not present

## 2018-06-21 DIAGNOSIS — E291 Testicular hypofunction: Secondary | ICD-10-CM | POA: Diagnosis not present

## 2018-06-21 DIAGNOSIS — S2220XA Unspecified fracture of sternum, initial encounter for closed fracture: Secondary | ICD-10-CM | POA: Diagnosis not present

## 2018-06-21 DIAGNOSIS — M5137 Other intervertebral disc degeneration, lumbosacral region: Secondary | ICD-10-CM | POA: Diagnosis not present

## 2018-07-05 DIAGNOSIS — E291 Testicular hypofunction: Secondary | ICD-10-CM | POA: Diagnosis not present

## 2018-07-19 DIAGNOSIS — E291 Testicular hypofunction: Secondary | ICD-10-CM | POA: Diagnosis not present

## 2018-07-19 DIAGNOSIS — E1140 Type 2 diabetes mellitus with diabetic neuropathy, unspecified: Secondary | ICD-10-CM | POA: Diagnosis not present

## 2018-07-19 DIAGNOSIS — Z79899 Other long term (current) drug therapy: Secondary | ICD-10-CM | POA: Diagnosis not present

## 2018-07-19 DIAGNOSIS — G8929 Other chronic pain: Secondary | ICD-10-CM | POA: Diagnosis not present

## 2018-07-19 DIAGNOSIS — Z125 Encounter for screening for malignant neoplasm of prostate: Secondary | ICD-10-CM | POA: Diagnosis not present

## 2018-07-19 DIAGNOSIS — M5137 Other intervertebral disc degeneration, lumbosacral region: Secondary | ICD-10-CM | POA: Diagnosis not present

## 2018-07-19 DIAGNOSIS — I10 Essential (primary) hypertension: Secondary | ICD-10-CM | POA: Diagnosis not present

## 2018-07-19 DIAGNOSIS — S2220XA Unspecified fracture of sternum, initial encounter for closed fracture: Secondary | ICD-10-CM | POA: Diagnosis not present

## 2018-07-19 DIAGNOSIS — E785 Hyperlipidemia, unspecified: Secondary | ICD-10-CM | POA: Diagnosis not present

## 2018-07-31 DIAGNOSIS — J0191 Acute recurrent sinusitis, unspecified: Secondary | ICD-10-CM | POA: Diagnosis not present

## 2018-07-31 DIAGNOSIS — Z23 Encounter for immunization: Secondary | ICD-10-CM | POA: Diagnosis not present

## 2018-07-31 DIAGNOSIS — B9689 Other specified bacterial agents as the cause of diseases classified elsewhere: Secondary | ICD-10-CM | POA: Diagnosis not present

## 2018-07-31 DIAGNOSIS — S91331A Puncture wound without foreign body, right foot, initial encounter: Secondary | ICD-10-CM | POA: Diagnosis not present

## 2018-07-31 DIAGNOSIS — R05 Cough: Secondary | ICD-10-CM | POA: Diagnosis not present

## 2018-07-31 DIAGNOSIS — R51 Headache: Secondary | ICD-10-CM | POA: Diagnosis not present

## 2018-08-02 DIAGNOSIS — E291 Testicular hypofunction: Secondary | ICD-10-CM | POA: Diagnosis not present

## 2018-08-04 DIAGNOSIS — I2510 Atherosclerotic heart disease of native coronary artery without angina pectoris: Secondary | ICD-10-CM | POA: Diagnosis not present

## 2018-08-04 DIAGNOSIS — I420 Dilated cardiomyopathy: Secondary | ICD-10-CM | POA: Diagnosis not present

## 2018-08-04 DIAGNOSIS — I255 Ischemic cardiomyopathy: Secondary | ICD-10-CM | POA: Diagnosis not present

## 2018-08-15 IMAGING — MR MR SHOULDER*L* W/O CM
4 series · 31 of 40 positions shown · non-contrast
Comparison: None.

CLINICAL DATA: Left shoulder pain since April 2016 after fall.
Temporary relief 3 months ago after steroid injection.

EXAM:
MRI OF THE LEFT SHOULDER WITHOUT CONTRAST
TECHNIQUE: Multiplanar, multisequence MR imaging of the shoulder was performed.
No intravenous contrast was administered.

[Series 3: T2 fat-sat · axial · 4.0mm · 0.55mm/px · z∈[-44,+36]mm · 9 of 20 slices shown (1 of 3)]
[im 1/20]
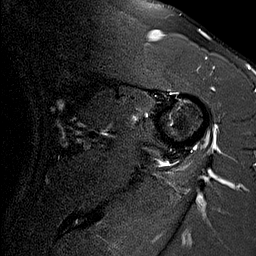
[im 3/20]
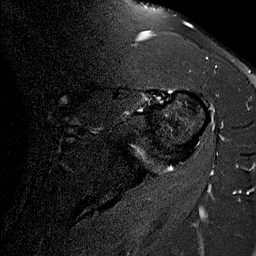
[im 5/20]
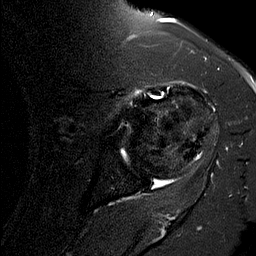
[im 8/20]
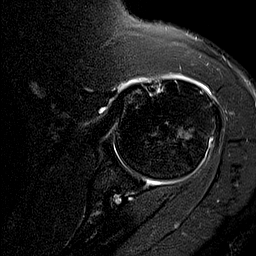
[im 10/20]
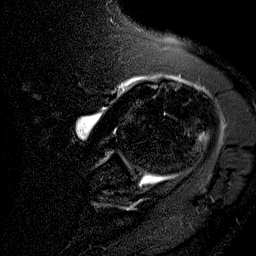
[im 12/20]
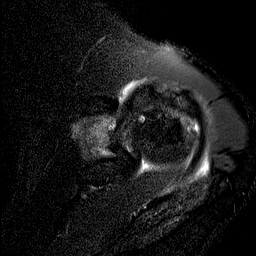
[im 15/20]
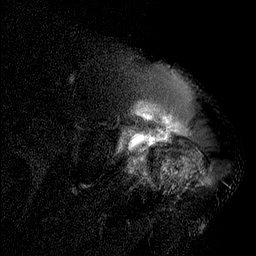
[im 17/20]
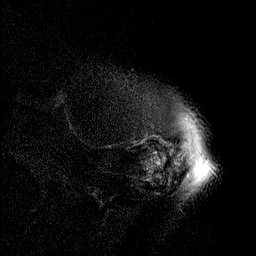
[im 20/20]
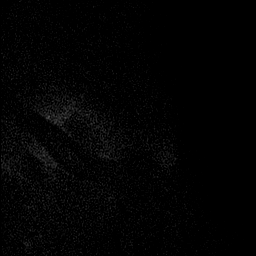

[Series 5: T1 · oblique · 4.0mm · 0.22mm/px · 3 of 22 slices shown]
[im 3/22]
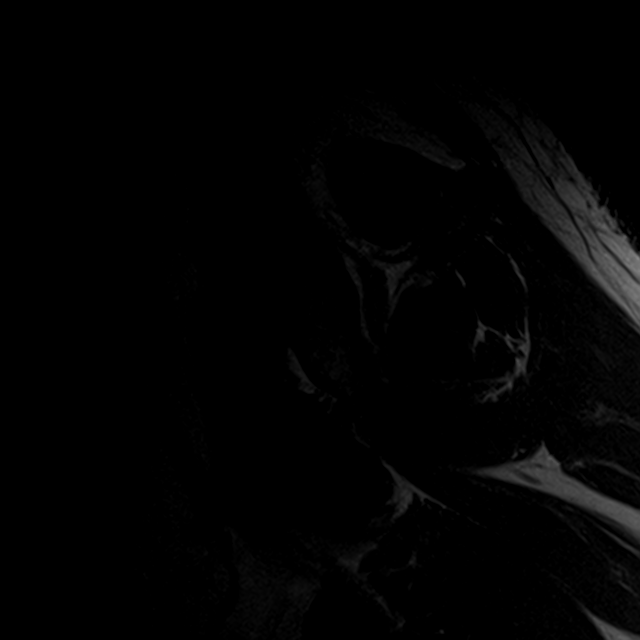
[im 11/22]
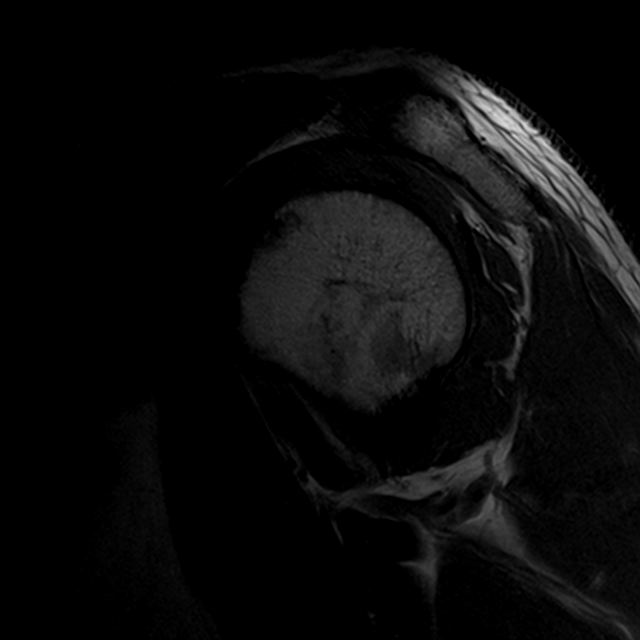
[im 19/22]
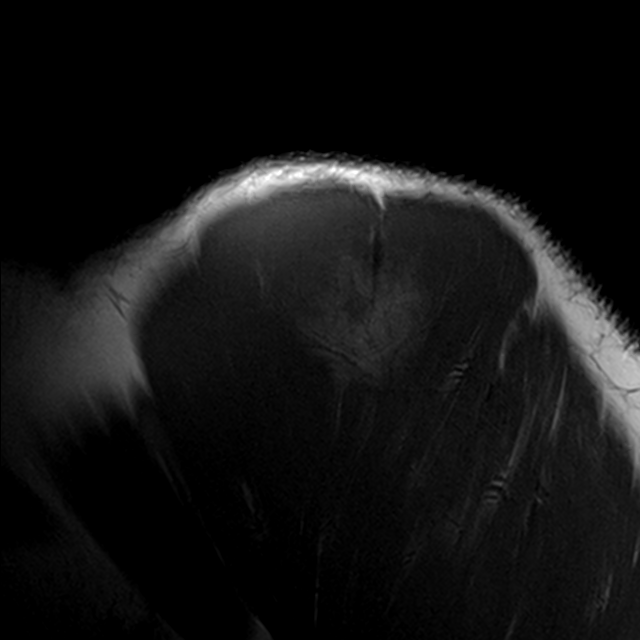

[Series 7: T2 fat-sat · oblique · 4.0mm · 0.55mm/px · 11 of 22 slices shown (2 of 3)]
[im 1/22]
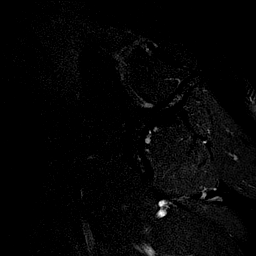
[im 3/22]
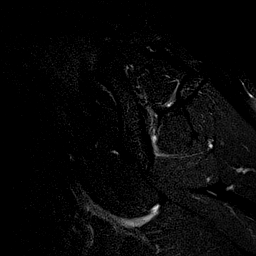
[im 5/22]
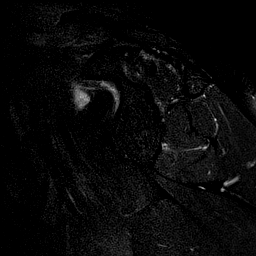
[im 7/22]
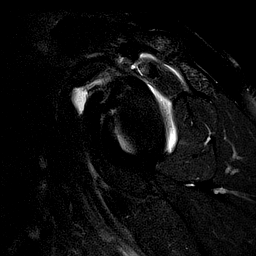
[im 9/22]
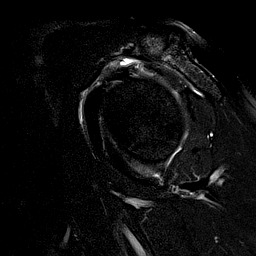
[im 11/22]
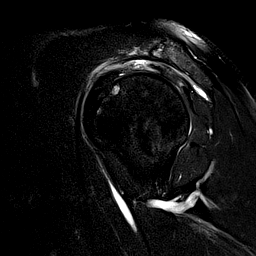
[im 13/22]
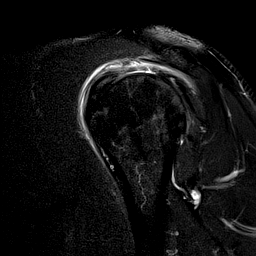
[im 15/22]
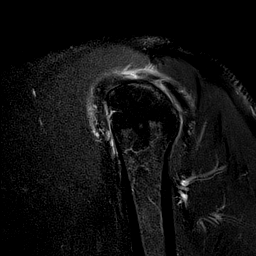
[im 17/22]
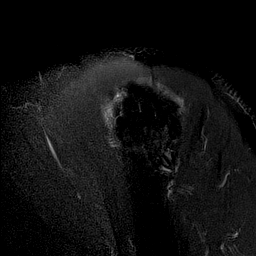
[im 19/22]
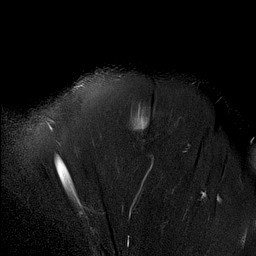
[im 22/22]
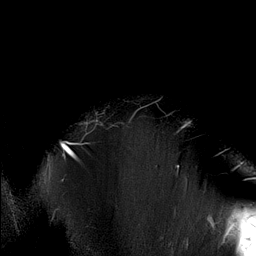

[Series 8: T2 fat-sat · oblique · 4.0mm · 0.27mm/px · 8 of 18 slices shown (3 of 3)]
[im 1/18]
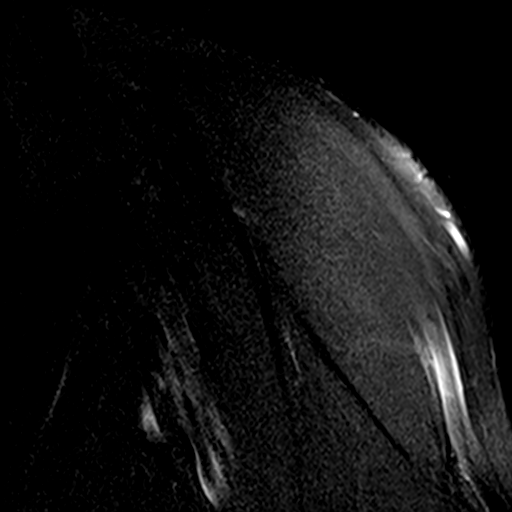
[im 3/18]
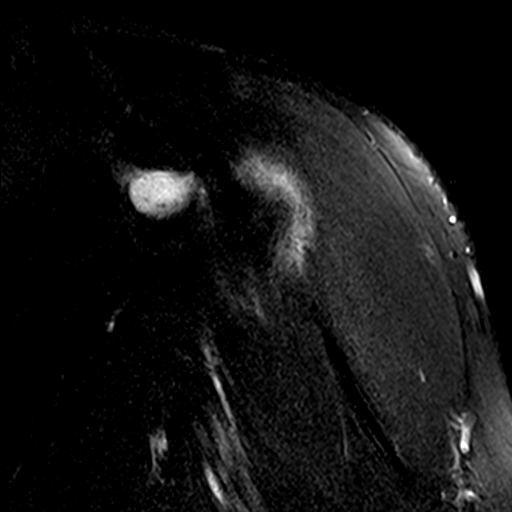
[im 5/18]
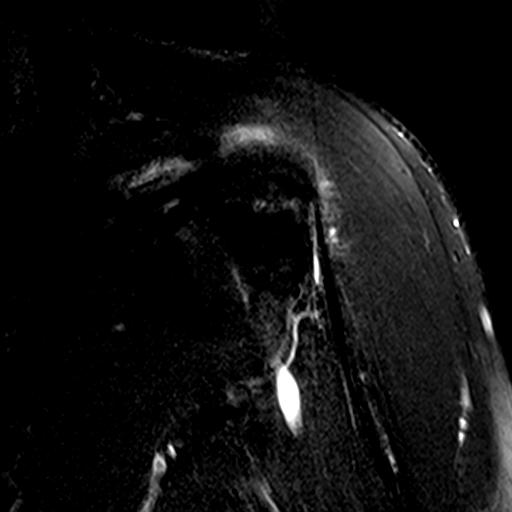
[im 7/18]
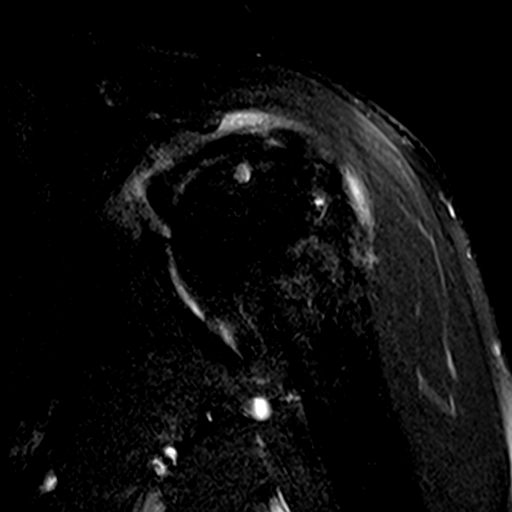
[im 9/18]
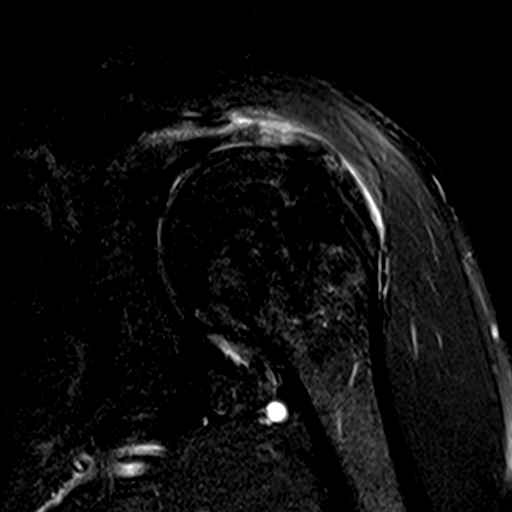
[im 11/18]
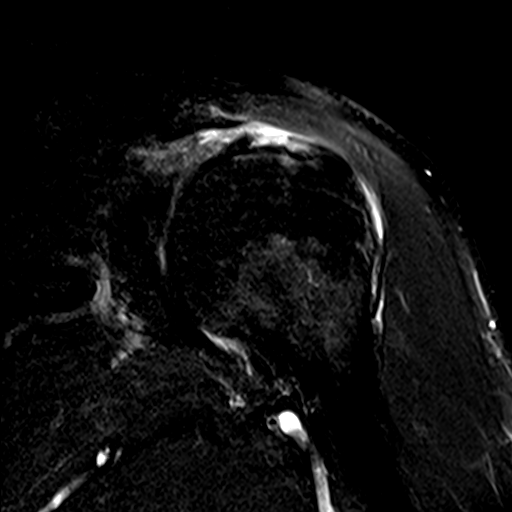
[im 13/18]
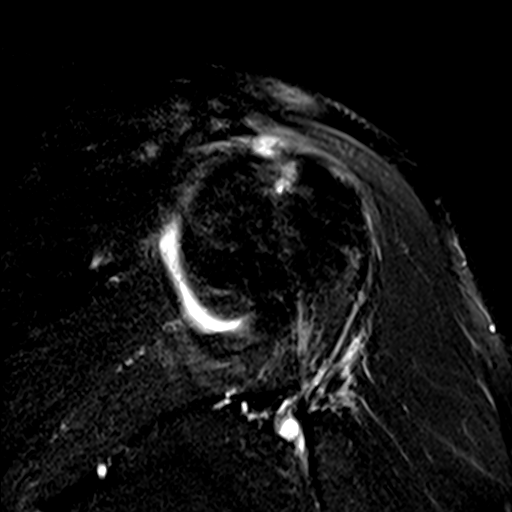
[im 15/18]
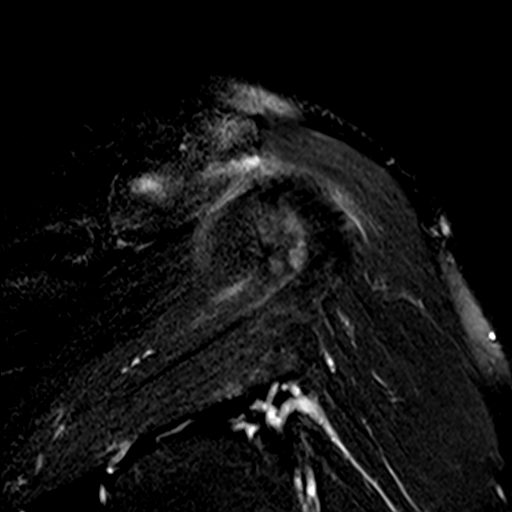

[31 of 40 positions shown; findings below may reference images not displayed]

FINDINGS: Rotator cuff: Full-thickness, partial width tear of the
supraspinatus tendon measuring 10 x 12 mm in the coronal oblique by
AP dimension involving the cable portion of the tendon. The
infraspinatus, teres minor and subscapularis tendons are intact.

Muscles:  No muscle atrophy.

Biceps long head:  Intact.

Acromioclavicular Joint: Moderate arthropathy of the
acromioclavicular joint. Type III acromion. Small amount of
subacromial and subdeltoid bursal fluid consistent with the
full-thickness tear.

Glenohumeral Joint: No focal chondral defect. Mild chondral thinning
of the humeral head and glenoid cartilage.

Labrum: Grossly intact, but evaluation is limited by lack of
intraarticular fluid.

Bones: Subcortical cystic change along the posterolateral humeral
head may reflect changes of posterior impingement. No fracture or
dislocation. No suspicious osseous lesions.

Other: None.
IMPRESSION: 1. Full-thickness, partial width tear of the supraspinatus tendon
measuring 10 x 12 mm in the coronal oblique by AP dimension.
Associated small amount of subacromial and subdeltoid bursal fluid.
No muscle atrophy.
2. Moderate AC joint osteoarthritis with type 3 slightly hook shaped
acromion.

## 2018-08-16 DIAGNOSIS — E291 Testicular hypofunction: Secondary | ICD-10-CM | POA: Diagnosis not present

## 2018-08-16 DIAGNOSIS — I10 Essential (primary) hypertension: Secondary | ICD-10-CM | POA: Diagnosis not present

## 2018-08-16 DIAGNOSIS — S2220XA Unspecified fracture of sternum, initial encounter for closed fracture: Secondary | ICD-10-CM | POA: Diagnosis not present

## 2018-08-16 DIAGNOSIS — Z79899 Other long term (current) drug therapy: Secondary | ICD-10-CM | POA: Diagnosis not present

## 2018-08-16 DIAGNOSIS — M5137 Other intervertebral disc degeneration, lumbosacral region: Secondary | ICD-10-CM | POA: Diagnosis not present

## 2018-08-16 DIAGNOSIS — G8929 Other chronic pain: Secondary | ICD-10-CM | POA: Diagnosis not present

## 2018-08-20 DIAGNOSIS — E119 Type 2 diabetes mellitus without complications: Secondary | ICD-10-CM | POA: Diagnosis not present

## 2018-08-20 DIAGNOSIS — J0100 Acute maxillary sinusitis, unspecified: Secondary | ICD-10-CM | POA: Diagnosis not present

## 2018-08-23 DIAGNOSIS — F419 Anxiety disorder, unspecified: Secondary | ICD-10-CM | POA: Diagnosis not present

## 2018-08-23 DIAGNOSIS — B9689 Other specified bacterial agents as the cause of diseases classified elsewhere: Secondary | ICD-10-CM | POA: Diagnosis not present

## 2018-08-23 DIAGNOSIS — I10 Essential (primary) hypertension: Secondary | ICD-10-CM | POA: Diagnosis not present

## 2018-08-23 DIAGNOSIS — K0510 Chronic gingivitis, plaque induced: Secondary | ICD-10-CM | POA: Diagnosis not present

## 2018-08-30 DIAGNOSIS — E291 Testicular hypofunction: Secondary | ICD-10-CM | POA: Diagnosis not present

## 2018-09-02 DIAGNOSIS — Z136 Encounter for screening for cardiovascular disorders: Secondary | ICD-10-CM | POA: Diagnosis not present

## 2018-09-02 DIAGNOSIS — Z1331 Encounter for screening for depression: Secondary | ICD-10-CM | POA: Diagnosis not present

## 2018-09-02 DIAGNOSIS — Z125 Encounter for screening for malignant neoplasm of prostate: Secondary | ICD-10-CM | POA: Diagnosis not present

## 2018-09-02 DIAGNOSIS — E785 Hyperlipidemia, unspecified: Secondary | ICD-10-CM | POA: Diagnosis not present

## 2018-09-02 DIAGNOSIS — Z0000 Encounter for general adult medical examination without abnormal findings: Secondary | ICD-10-CM | POA: Diagnosis not present

## 2018-09-02 DIAGNOSIS — Z9181 History of falling: Secondary | ICD-10-CM | POA: Diagnosis not present

## 2018-09-02 DIAGNOSIS — Z6830 Body mass index (BMI) 30.0-30.9, adult: Secondary | ICD-10-CM | POA: Diagnosis not present

## 2018-09-07 ENCOUNTER — Other Ambulatory Visit: Payer: Self-pay | Admitting: Podiatry

## 2018-09-09 DIAGNOSIS — R0602 Shortness of breath: Secondary | ICD-10-CM | POA: Diagnosis not present

## 2018-09-09 DIAGNOSIS — M545 Low back pain: Secondary | ICD-10-CM | POA: Diagnosis not present

## 2018-09-09 DIAGNOSIS — E876 Hypokalemia: Secondary | ICD-10-CM | POA: Diagnosis not present

## 2018-09-09 DIAGNOSIS — G8929 Other chronic pain: Secondary | ICD-10-CM | POA: Diagnosis not present

## 2018-09-09 DIAGNOSIS — J0111 Acute recurrent frontal sinusitis: Secondary | ICD-10-CM | POA: Diagnosis not present

## 2018-09-13 ENCOUNTER — Ambulatory Visit (INDEPENDENT_AMBULATORY_CARE_PROVIDER_SITE_OTHER): Payer: Medicare Other | Admitting: Podiatry

## 2018-09-13 ENCOUNTER — Other Ambulatory Visit: Payer: Self-pay | Admitting: Podiatry

## 2018-09-13 DIAGNOSIS — S2220XA Unspecified fracture of sternum, initial encounter for closed fracture: Secondary | ICD-10-CM | POA: Diagnosis not present

## 2018-09-13 DIAGNOSIS — G8929 Other chronic pain: Secondary | ICD-10-CM | POA: Diagnosis not present

## 2018-09-13 DIAGNOSIS — Z79899 Other long term (current) drug therapy: Secondary | ICD-10-CM | POA: Diagnosis not present

## 2018-09-13 DIAGNOSIS — M5137 Other intervertebral disc degeneration, lumbosacral region: Secondary | ICD-10-CM | POA: Diagnosis not present

## 2018-09-13 DIAGNOSIS — Z5329 Procedure and treatment not carried out because of patient's decision for other reasons: Secondary | ICD-10-CM

## 2018-09-13 DIAGNOSIS — M79671 Pain in right foot: Secondary | ICD-10-CM

## 2018-09-13 DIAGNOSIS — I10 Essential (primary) hypertension: Secondary | ICD-10-CM | POA: Diagnosis not present

## 2018-09-13 DIAGNOSIS — E291 Testicular hypofunction: Secondary | ICD-10-CM | POA: Diagnosis not present

## 2018-09-13 NOTE — Progress Notes (Signed)
   Complete physical exam  Patient: Alejandro Taylor   DOB: 01/04/1998   58 y.o. Male  MRN: [id number]  Subjective:    No chief complaint on file.   Alejandro Taylor is a 58 y.o. male who presents today for a complete physical exam. She reports consuming a {diet types:17450} diet. {types:19826} She generally feels {DESC; WELL/FAIRLY WELL/POORLY:18703}. She reports sleeping {DESC; WELL/FAIRLY WELL/POORLY:18703}. She {does/does not:200015} have additional problems to discuss today.    Most recent fall risk assessment:    09/11/2021   10:42 AM  Fall Risk   Falls in the past year? 0  Number falls in past yr: 0  Injury with Fall? 0  Risk for fall due to : No Fall Risks  Follow up Falls evaluation completed     Most recent depression screenings:    09/11/2021   10:42 AM 08/02/2020   10:46 AM  PHQ 2/9 Scores  PHQ - 2 Score 0 0  PHQ- 9 Score 5     {VISON DENTAL STD PSA (Optional):27386}  {History (Optional):23778}  Patient Care Team: Jessup, Joy, NP as PCP - General (Nurse Practitioner)   Outpatient Medications Prior to Visit  Medication Sig   fluticasone (FLONASE) 50 MCG/ACT nasal spray Place 2 sprays into both nostrils in the morning and at bedtime. After 7 days, reduce to once daily.   norgestimate-ethinyl estradiol (SPRINTEC 28) 0.25-35 MG-MCG tablet Take 1 tablet by mouth daily.   Nystatin POWD Apply liberally to affected area 2 times per day   spironolactone (ALDACTONE) 100 MG tablet Take 1 tablet (100 mg total) by mouth daily.   No facility-administered medications prior to visit.    ROS        Objective:     There were no vitals taken for this visit. {Vitals History (Optional):23777}  Physical Exam   No results found for any visits on 10/17/21. {Show previous labs (optional):23779}    Assessment & Plan:    Routine Health Maintenance and Physical Exam  Immunization History  Administered Date(s) Administered   DTaP 03/20/1999, 05/16/1999,  07/25/1999, 04/09/2000, 10/24/2003   Hepatitis A 08/20/2007, 08/25/2008   Hepatitis B 01/05/1999, 02/12/1999, 07/25/1999   HiB (PRP-OMP) 03/20/1999, 05/16/1999, 07/25/1999, 04/09/2000   IPV 03/20/1999, 05/16/1999, 01/13/2000, 10/24/2003   Influenza,inj,Quad PF,6+ Mos 11/25/2013   Influenza-Unspecified 02/25/2012   MMR 01/12/2001, 10/24/2003   Meningococcal Polysaccharide 08/25/2011   Pneumococcal Conjugate-13 04/09/2000   Pneumococcal-Unspecified 07/25/1999, 10/08/1999   Tdap 08/25/2011   Varicella 01/13/2000, 08/20/2007    Health Maintenance  Topic Date Due   HIV Screening  Never done   Hepatitis C Screening  Never done   INFLUENZA VACCINE  10/15/2021   PAP-Cervical Cytology Screening  10/17/2021 (Originally 01/04/2020)   PAP SMEAR-Modifier  10/17/2021 (Originally 01/04/2020)   TETANUS/TDAP  10/17/2021 (Originally 08/24/2021)   HPV VACCINES  Discontinued   COVID-19 Vaccine  Discontinued    Discussed health benefits of physical activity, and encouraged her to engage in regular exercise appropriate for her age and condition.  Problem List Items Addressed This Visit   None Visit Diagnoses     Annual physical exam    -  Primary   Cervical cancer screening       Need for Tdap vaccination          No follow-ups on file.     Joy Jessup, NP   

## 2018-09-14 ENCOUNTER — Encounter: Payer: Self-pay | Admitting: Podiatry

## 2018-09-14 ENCOUNTER — Ambulatory Visit (INDEPENDENT_AMBULATORY_CARE_PROVIDER_SITE_OTHER): Payer: Medicare Other | Admitting: Podiatry

## 2018-09-14 ENCOUNTER — Ambulatory Visit (INDEPENDENT_AMBULATORY_CARE_PROVIDER_SITE_OTHER): Payer: Medicare Other

## 2018-09-14 ENCOUNTER — Other Ambulatory Visit: Payer: Self-pay | Admitting: Podiatry

## 2018-09-14 ENCOUNTER — Other Ambulatory Visit: Payer: Self-pay

## 2018-09-14 VITALS — Temp 99.1°F | Resp 16

## 2018-09-14 DIAGNOSIS — L923 Foreign body granuloma of the skin and subcutaneous tissue: Secondary | ICD-10-CM

## 2018-09-14 DIAGNOSIS — M79671 Pain in right foot: Secondary | ICD-10-CM | POA: Diagnosis not present

## 2018-09-14 DIAGNOSIS — M6020 Foreign body granuloma of soft tissue, not elsewhere classified, unspecified site: Secondary | ICD-10-CM

## 2018-09-14 DIAGNOSIS — M60272 Foreign body granuloma of soft tissue, not elsewhere classified, left ankle and foot: Secondary | ICD-10-CM | POA: Diagnosis not present

## 2018-09-14 NOTE — Progress Notes (Signed)
Subjective:  Patient ID: Alejandro Taylor, male    DOB: [birth year],  MRN: [id number]  Chief Complaint  Patient presents with  . Foreign Body    Foreign body at [place] [facility]; pt stepped on a piece of wood and one small piece went through the foot x  mo; 7/10 pain -pt denies drainage/redness/swelling Tx: pumice stone and trimming     58 y.o. male presents for concern of a foreign body to the left foot.  History as above   Review of Systems: Negative except as noted in the HPI. Denies N/V/F/Ch.  Past Medical History:  Diagnosis Date  . Anginal pain ([place])   . Anxiety   . Arthritis   . CHF (congestive heart failure) ([place])   . Depression   . Diabetes mellitus without complication ([place])    Type II  . GERD (gastroesophageal reflux disease)   . Hypertension   . Myocardial infarction ([place])    x2  . On home oxygen therapy    only as needed for sleep apnea  . Pneumonia   . Rotator cuff injury, left, initial encounter   . Sleep apnea     Current Outpatient Medications:  .  ALPRAZolam (XANAX) 0.5 MG tablet, Take 0.125-0.25 mg by mouth 2 (two) times daily as needed for anxiety., Disp: , Rfl:  .  aspirin EC 81 MG tablet, Take 81 mg by mouth at bedtime., Disp: , Rfl:  .  carvedilol (COREG) 12.5 MG tablet, , Disp: , Rfl:  .  clindamycin (CLINDAGEL) 1 % gel, Apply 1 application topically 2 (two) times daily., Disp: , Rfl:  .  colesevelam (WELCHOL) 625 MG tablet, Take 1,875 mg by mouth 2 (two) times daily., Disp: , Rfl:  .  furosemide (LASIX) 40 MG tablet, Take 20 mg by mouth daily., Disp: , Rfl:  .  insulin glargine (LANTUS) 100 UNIT/ML injection, Inject 35 Units into the skin every evening., Disp: , Rfl:  .  insulin lispro (HUMALOG) 100 UNIT/ML injection, Inject 2-3 Units into the skin every evening., Disp: , Rfl:  .  ipratropium-albuterol (DUONEB) 0.5-2.5 (3) MG/3ML SOLN, Inhale 3 mLs into the lungs 4 (four) times daily as needed for shortness of breath., Disp: , Rfl:  .  lisinopril  (PRINIVIL,ZESTRIL) 10 MG tablet, , Disp: , Rfl:  .  neomycin-polymyxin-hydrocortisone (CORTISPORIN) OTIC solution, Apply 2 drops to the ingrown toenail site twice daily. Cover with band-aid., Disp: 10 mL, Rfl: 0 .  nystatin cream (MYCOSTATIN), Apply 1 application topically daily as needed (skin flares). , Disp: , Rfl:  .  omeprazole (PRILOSEC) 40 MG capsule, Take 40 mg by mouth daily., Disp: , Rfl:  .  oxycodone (ROXICODONE) 30 MG immediate release tablet, Take 60 mg by mouth every 4 (four) hours., Disp: , Rfl:  .  OXYCONTIN 80 MG 12 hr tablet, Take 40 mg by mouth 2 (two) times daily as needed (pain). , Disp: , Rfl:  .  PARoxetine (PAXIL) 20 MG tablet, , Disp: , Rfl:  .  PROAIR HFA 108 (90 Base) MCG/ACT inhaler, Inhale 2 puffs into the lungs every 6 (six) hours as needed for shortness of breath., Disp: , Rfl:  .  psyllium (METAMUCIL) 0.52 g capsule, Take 0.52 g by mouth 2 (two) times daily., Disp: , Rfl:  .  tamsulosin (FLOMAX) 0.4 MG CAPS capsule, Take 0.4 mg by mouth daily., Disp: , Rfl:  .  testosterone cypionate (DEPOTESTOSTERONE CYPIONATE) 200 MG/ML injection, Inject 1 dose every 30 days, Disp: , Rfl:  .  Ticagrelor (BRILINTA PO), Take 45 mg by mouth 2 (two) times daily., Disp: , Rfl:   Social History   Tobacco Use  Smoking Status Former Smoker  . Types: Cigarettes  Smokeless Tobacco Current User  . Types: Chew    Allergies  Allergen Reactions  . Benicar [Olmesartan]     Caused issues with gallbladder   . Ezetimibe Other (See Comments)    Muscle weakness.  . Latex Itching and Swelling  . Moxifloxacin     Unknown   . Prednisone Swelling    Not allergic to prednisolone   . Rosuvastatin     Leg cramps    Objective:   Vitals:   09/14/18 1154  Resp: 16  Temp: 99.1 F (37.3 C)   There is no height or weight on file to calculate BMI. Constitutional Well developed. Well nourished.  Vascular Dorsalis pedis pulses palpable bilaterally. Posterior tibial pulses palpable  bilaterally. Capillary refill normal to all digits.  No cyanosis or clubbing noted. Pedal hair growth normal.  Neurologic Normal speech. Oriented to person, place, and time. Protective sensation absent  Dermatologic Hyperkeratosis left sub-met 5 small splinter like particles  Orthopedic: No pain to palpation either foot.   Radiographs: Taken and reviewed no acute fractures dislocations.  No evidence Assessment:  No diagnosis found. Plan:  Patient was evaluated and treated and all questions answered.  ?  Foreign body/splinter left -Area thoroughly debrided tissue splinters noted but no true evident foreign bodies -Patient verbalized relief post debridement -Discussed possible return of hyperkeratosis -Padding dispensed  Return in about 6 weeks (around 10/26/2018) for Foreign body f/u.

## 2018-09-28 DIAGNOSIS — E291 Testicular hypofunction: Secondary | ICD-10-CM | POA: Diagnosis not present

## 2018-10-11 DIAGNOSIS — G8929 Other chronic pain: Secondary | ICD-10-CM | POA: Diagnosis not present

## 2018-10-11 DIAGNOSIS — I10 Essential (primary) hypertension: Secondary | ICD-10-CM | POA: Diagnosis not present

## 2018-10-11 DIAGNOSIS — M5137 Other intervertebral disc degeneration, lumbosacral region: Secondary | ICD-10-CM | POA: Diagnosis not present

## 2018-10-11 DIAGNOSIS — E291 Testicular hypofunction: Secondary | ICD-10-CM | POA: Diagnosis not present

## 2018-10-11 DIAGNOSIS — S2220XA Unspecified fracture of sternum, initial encounter for closed fracture: Secondary | ICD-10-CM | POA: Diagnosis not present

## 2018-10-25 DIAGNOSIS — E291 Testicular hypofunction: Secondary | ICD-10-CM | POA: Diagnosis not present

## 2018-10-26 ENCOUNTER — Ambulatory Visit (INDEPENDENT_AMBULATORY_CARE_PROVIDER_SITE_OTHER): Payer: Medicare Other | Admitting: Podiatry

## 2018-10-26 ENCOUNTER — Other Ambulatory Visit: Payer: Self-pay

## 2018-10-26 ENCOUNTER — Encounter: Payer: Self-pay | Admitting: Podiatry

## 2018-10-26 VITALS — Temp 99.4°F | Resp 16

## 2018-10-26 DIAGNOSIS — G5761 Lesion of plantar nerve, right lower limb: Secondary | ICD-10-CM | POA: Diagnosis not present

## 2018-10-26 DIAGNOSIS — M60272 Foreign body granuloma of soft tissue, not elsewhere classified, left ankle and foot: Secondary | ICD-10-CM

## 2018-10-26 NOTE — Progress Notes (Signed)
Subjective:  Patient ID: Alejandro Taylor, male    DOB: 18-Aug-1960,  MRN: [id number]  Chief Complaint  Patient presents with  . Foreign Body    F/U Lt splinter Pt.s tates," it's better, feels better." Tx: none -pt denies pain/swelling Tx: none  . Foot Pain    Rt sub met 2 and 3 pain x 1 mo; 5/10 sharp pains -pt denies injury -pt states,: it feels swollen Tx: none   . Diabetes    FBS: 140 A1C: 8   58 y.o. male presents with the above complaint. Hx as above.  Review of Systems: Negative except as noted in the HPI. Denies N/V/F/Ch.  Past Medical History:  Diagnosis Date  . Anginal pain ([place])   . Anxiety   . Arthritis   . CHF (congestive heart failure) ([place])   . Depression   . Diabetes mellitus without complication ([place])    Type II  . GERD (gastroesophageal reflux disease)   . Hypertension   . Myocardial infarction ([place])    x2  . On home oxygen therapy    only as needed for sleep apnea  . Pneumonia   . Rotator cuff injury, left, initial encounter   . Sleep apnea     Current Outpatient Medications:  .  ALPRAZolam (XANAX) 0.5 MG tablet, Take 0.125-0.25 mg by mouth 2 (two) times daily as needed for anxiety., Disp: , Rfl:  .  aspirin EC 81 MG tablet, Take 81 mg by mouth at bedtime., Disp: , Rfl:  .  carvedilol (COREG) 12.5 MG tablet, , Disp: , Rfl:  .  clindamycin (CLINDAGEL) 1 % gel, Apply 1 application topically 2 (two) times daily., Disp: , Rfl:  .  colesevelam (WELCHOL) 625 MG tablet, Take 1,875 mg by mouth 2 (two) times daily., Disp: , Rfl:  .  furosemide (LASIX) 40 MG tablet, Take 20 mg by mouth daily., Disp: , Rfl:  .  insulin glargine (LANTUS) 100 UNIT/ML injection, Inject 35 Units into the skin every evening., Disp: , Rfl:  .  insulin lispro (HUMALOG) 100 UNIT/ML injection, Inject 2-3 Units into the skin every evening., Disp: , Rfl:  .  ipratropium-albuterol (DUONEB) 0.5-2.5 (3) MG/3ML SOLN, Inhale 3 mLs into the lungs 4 (four) times daily as needed for shortness of  breath., Disp: , Rfl:  .  lisinopril (PRINIVIL,ZESTRIL) 10 MG tablet, , Disp: , Rfl:  .  neomycin-polymyxin-hydrocortisone (CORTISPORIN) OTIC solution, Apply 2 drops to the ingrown toenail site twice daily. Cover with band-aid., Disp: 10 mL, Rfl: 0 .  nystatin cream (MYCOSTATIN), Apply 1 application topically daily as needed (skin flares). , Disp: , Rfl:  .  omeprazole (PRILOSEC) 40 MG capsule, Take 40 mg by mouth daily., Disp: , Rfl:  .  oxycodone (ROXICODONE) 30 MG immediate release tablet, Take 60 mg by mouth every 4 (four) hours., Disp: , Rfl:  .  OXYCONTIN 80 MG 12 hr tablet, Take 40 mg by mouth 2 (two) times daily as needed (pain). , Disp: , Rfl:  .  PARoxetine (PAXIL) 20 MG tablet, , Disp: , Rfl:  .  PROAIR HFA 108 (90 Base) MCG/ACT inhaler, Inhale 2 puffs into the lungs every 6 (six) hours as needed for shortness of breath., Disp: , Rfl:  .  psyllium (METAMUCIL) 0.52 g capsule, Take 0.52 g by mouth 2 (two) times daily., Disp: , Rfl:  .  tamsulosin (FLOMAX) 0.4 MG CAPS capsule, Take 0.4 mg by mouth daily., Disp: , Rfl:  .  testosterone cypionate (DEPOTESTOSTERONE CYPIONATE)  200 MG/ML injection, Inject 1 dose every 30 days, Disp: , Rfl:  .  Ticagrelor (BRILINTA PO), Take 45 mg by mouth 2 (two) times daily., Disp: , Rfl:   Social History   Tobacco Use  Smoking Status Former Smoker  . Types: Cigarettes  Smokeless Tobacco Current User  . Types: Chew    Allergies  Allergen Reactions  . Benicar [Olmesartan]     Caused issues with gallbladder   . Ezetimibe Other (See Comments)    Muscle weakness.  . Latex Itching and Swelling  . Moxifloxacin     Unknown   . Prednisone Swelling    Not allergic to prednisolone   . Rosuvastatin     Leg cramps    Objective:   Vitals:   10/26/18 1032  Resp: 16  Temp: 99.4 F (37.4 C)   There is no height or weight on file to calculate BMI. Constitutional Well developed. Well nourished.  Vascular Dorsalis pedis pulses palpable bilaterally.  Posterior tibial pulses palpable bilaterally. Capillary refill normal to all digits.  No cyanosis or clubbing noted. Pedal hair growth normal.  Neurologic Normal speech. Oriented to person, place, and time. Epicritic sensation to light touch grossly present bilaterally.  Dermatologic Nails well groomed and normal in appearance. No open wounds. No skin lesions.  Orthopedic: POP 2nd interspace and 2nd MPJ. No pain at area of foreign body removal left.   Radiographs: None today Assessment:   1. Morton neuroma, right   2. Foreign body granuloma of soft tissue of left foot    Plan:  Patient was evaluated and treated and all questions answered.  Interdigital Neuroma, right -Educated on etiology -Interspace injection delivered as below. -Educated on padding and proper shoegear  Procedure: Neuroma Injection Location: Right 2nd interspace Skin Prep: Alcohol. Injectate: 0.5 cc 0.5% marcaine plain, 0.5 cc dexamethasone phosphate. Disposition: Patient tolerated procedure well. Injection site dressed with a band-aid.  Foreign body left -Doing well no issues.  Return in about 3 weeks (around 11/16/2018) for Neuroma, Right.

## 2018-11-08 DIAGNOSIS — E291 Testicular hypofunction: Secondary | ICD-10-CM | POA: Diagnosis not present

## 2018-11-08 DIAGNOSIS — M5137 Other intervertebral disc degeneration, lumbosacral region: Secondary | ICD-10-CM | POA: Diagnosis not present

## 2018-11-08 DIAGNOSIS — E876 Hypokalemia: Secondary | ICD-10-CM | POA: Diagnosis not present

## 2018-11-08 DIAGNOSIS — Z79899 Other long term (current) drug therapy: Secondary | ICD-10-CM | POA: Diagnosis not present

## 2018-11-08 DIAGNOSIS — S2220XA Unspecified fracture of sternum, initial encounter for closed fracture: Secondary | ICD-10-CM | POA: Diagnosis not present

## 2018-11-08 DIAGNOSIS — G8929 Other chronic pain: Secondary | ICD-10-CM | POA: Diagnosis not present

## 2018-11-08 DIAGNOSIS — I10 Essential (primary) hypertension: Secondary | ICD-10-CM | POA: Diagnosis not present

## 2018-11-16 ENCOUNTER — Ambulatory Visit (INDEPENDENT_AMBULATORY_CARE_PROVIDER_SITE_OTHER): Payer: Medicare Other | Admitting: Podiatry

## 2018-11-16 DIAGNOSIS — Z5329 Procedure and treatment not carried out because of patient's decision for other reasons: Secondary | ICD-10-CM

## 2018-11-23 DIAGNOSIS — Z23 Encounter for immunization: Secondary | ICD-10-CM | POA: Diagnosis not present

## 2018-11-23 DIAGNOSIS — E291 Testicular hypofunction: Secondary | ICD-10-CM | POA: Diagnosis not present

## 2018-11-28 NOTE — Progress Notes (Signed)
No show for appt. 

## 2018-12-06 DIAGNOSIS — M5137 Other intervertebral disc degeneration, lumbosacral region: Secondary | ICD-10-CM | POA: Diagnosis not present

## 2018-12-06 DIAGNOSIS — S2220XA Unspecified fracture of sternum, initial encounter for closed fracture: Secondary | ICD-10-CM | POA: Diagnosis not present

## 2018-12-06 DIAGNOSIS — E291 Testicular hypofunction: Secondary | ICD-10-CM | POA: Diagnosis not present

## 2018-12-06 DIAGNOSIS — G8929 Other chronic pain: Secondary | ICD-10-CM | POA: Diagnosis not present

## 2018-12-06 DIAGNOSIS — I10 Essential (primary) hypertension: Secondary | ICD-10-CM | POA: Diagnosis not present

## 2018-12-07 ENCOUNTER — Ambulatory Visit (INDEPENDENT_AMBULATORY_CARE_PROVIDER_SITE_OTHER): Payer: Medicare Other | Admitting: Podiatry

## 2018-12-07 ENCOUNTER — Other Ambulatory Visit: Payer: Self-pay

## 2018-12-07 DIAGNOSIS — G5761 Lesion of plantar nerve, right lower limb: Secondary | ICD-10-CM | POA: Diagnosis not present

## 2018-12-07 DIAGNOSIS — M7752 Other enthesopathy of left foot: Secondary | ICD-10-CM

## 2018-12-07 NOTE — Progress Notes (Signed)
Subjective:  Patient ID: Alejandro Taylor, male    DOB: 07-Aug-1960,  MRN: [id number]  Chief Complaint  Patient presents with  . Neuroma    F/U Rt neuroma Pt. states," it was good with the shot for 3 1/2 wks than pain came right back." Tx: noen  . Foreign Body    F/U Lt foreign body Pt. states," acting up again on the same place 1 wk ago after last office visti."    58 y.o. male presents with the above complaint. Hx as above.  Review of Systems: Negative except as noted in the HPI. Denies N/V/F/Ch.  Past Medical History:  Diagnosis Date  . Anginal pain ([place])   . Anxiety   . Arthritis   . CHF (congestive heart failure) ([place])   . Depression   . Diabetes mellitus without complication ([place])    Type II  . GERD (gastroesophageal reflux disease)   . Hypertension   . Myocardial infarction ([place])    x2  . On home oxygen therapy    only as needed for sleep apnea  . Pneumonia   . Rotator cuff injury, left, initial encounter   . Sleep apnea     Current Outpatient Medications:  .  ALPRAZolam (XANAX) 0.5 MG tablet, Take 0.125-0.25 mg by mouth 2 (two) times daily as needed for anxiety., Disp: , Rfl:  .  aspirin EC 81 MG tablet, Take 81 mg by mouth at bedtime., Disp: , Rfl:  .  carvedilol (COREG) 12.5 MG tablet, , Disp: , Rfl:  .  clindamycin (CLINDAGEL) 1 % gel, Apply 1 application topically 2 (two) times daily., Disp: , Rfl:  .  colesevelam (WELCHOL) 625 MG tablet, Take 1,875 mg by mouth 2 (two) times daily., Disp: , Rfl:  .  furosemide (LASIX) 40 MG tablet, Take 20 mg by mouth daily., Disp: , Rfl:  .  insulin glargine (LANTUS) 100 UNIT/ML injection, Inject 35 Units into the skin every evening., Disp: , Rfl:  .  insulin lispro (HUMALOG) 100 UNIT/ML injection, Inject 2-3 Units into the skin every evening., Disp: , Rfl:  .  ipratropium-albuterol (DUONEB) 0.5-2.5 (3) MG/3ML SOLN, Inhale 3 mLs into the lungs 4 (four) times daily as needed for shortness of breath., Disp: , Rfl:  .   lisinopril (PRINIVIL,ZESTRIL) 10 MG tablet, , Disp: , Rfl:  .  neomycin-polymyxin-hydrocortisone (CORTISPORIN) OTIC solution, Apply 2 drops to the ingrown toenail site twice daily. Cover with band-aid., Disp: 10 mL, Rfl: 0 .  nystatin cream (MYCOSTATIN), Apply 1 application topically daily as needed (skin flares). , Disp: , Rfl:  .  omeprazole (PRILOSEC) 40 MG capsule, Take 40 mg by mouth daily., Disp: , Rfl:  .  oxycodone (ROXICODONE) 30 MG immediate release tablet, Take 60 mg by mouth every 4 (four) hours., Disp: , Rfl:  .  OXYCONTIN 80 MG 12 hr tablet, Take 40 mg by mouth 2 (two) times daily as needed (pain). , Disp: , Rfl:  .  PARoxetine (PAXIL) 20 MG tablet, , Disp: , Rfl:  .  PROAIR HFA 108 (90 Base) MCG/ACT inhaler, Inhale 2 puffs into the lungs every 6 (six) hours as needed for shortness of breath., Disp: , Rfl:  .  psyllium (METAMUCIL) 0.52 g capsule, Take 0.52 g by mouth 2 (two) times daily., Disp: , Rfl:  .  tamsulosin (FLOMAX) 0.4 MG CAPS capsule, Take 0.4 mg by mouth daily., Disp: , Rfl:  .  testosterone cypionate (DEPOTESTOSTERONE CYPIONATE) 200 MG/ML injection, Inject 1 dose every 30  days, Disp: , Rfl:  .  Ticagrelor (BRILINTA PO), Take 45 mg by mouth 2 (two) times daily., Disp: , Rfl:   Social History   Tobacco Use  Smoking Status Former Smoker  . Types: Cigarettes  Smokeless Tobacco Current User  . Types: Chew    Allergies  Allergen Reactions  . Benicar [Olmesartan]     Caused issues with gallbladder   . Ezetimibe Other (See Comments)    Muscle weakness.  . Latex Itching and Swelling  . Moxifloxacin     Unknown   . Prednisone Swelling    Not allergic to prednisolone   . Rosuvastatin     Leg cramps    Objective:   There were no vitals filed for this visit. There is no height or weight on file to calculate BMI. Constitutional Well developed. Well nourished.  Vascular Dorsalis pedis pulses palpable bilaterally. Posterior tibial pulses palpable bilaterally.  Capillary refill normal to all digits.  No cyanosis or clubbing noted. Pedal hair growth normal.  Neurologic Normal speech. Oriented to person, place, and time. Epicritic sensation to light touch grossly present bilaterally.  Dermatologic Nails well groomed and normal in appearance. No open wounds. No skin lesions.  Orthopedic: POP 2nd interspace and 2nd MPJ. No pain at area of foreign body removal left.   Radiographs: None today Assessment:   1. Capsulitis of metatarsophalangeal (MTP) joint of left foot   2. Morton neuroma, right    Plan:  Patient was evaluated and treated and all questions answered.  Interdigital Neuroma, right -Educated on etiology -Interspace injection delivered as below. -Educated on padding and proper shoegear  Procedure: Neuroma Injection Location: Right 2nd interspace Skin Prep: Alcohol. Injectate: 0.5 cc 0.5% marcaine plain, 0.5 cc dexamethasone phosphate. Disposition: Patient tolerated procedure well. Injection site dressed with a band-aid.  Porokeratosis left 5th MPJ, Capsulitis left -No foreign body evident -Lesion courtesy debrided. -Awaiting DM shoes  Return in about 3 weeks (around 12/28/2018) for Neuroma, Capsulitis.

## 2018-12-20 DIAGNOSIS — E291 Testicular hypofunction: Secondary | ICD-10-CM | POA: Diagnosis not present

## 2018-12-23 DIAGNOSIS — M542 Cervicalgia: Secondary | ICD-10-CM | POA: Diagnosis not present

## 2018-12-23 DIAGNOSIS — R9431 Abnormal electrocardiogram [ECG] [EKG]: Secondary | ICD-10-CM | POA: Diagnosis not present

## 2018-12-23 DIAGNOSIS — R0602 Shortness of breath: Secondary | ICD-10-CM | POA: Diagnosis not present

## 2018-12-23 DIAGNOSIS — I4519 Other right bundle-branch block: Secondary | ICD-10-CM | POA: Diagnosis not present

## 2018-12-23 DIAGNOSIS — I420 Dilated cardiomyopathy: Secondary | ICD-10-CM | POA: Diagnosis present

## 2018-12-23 DIAGNOSIS — E785 Hyperlipidemia, unspecified: Secondary | ICD-10-CM | POA: Diagnosis not present

## 2018-12-23 DIAGNOSIS — Z720 Tobacco use: Secondary | ICD-10-CM | POA: Diagnosis not present

## 2018-12-23 DIAGNOSIS — I213 ST elevation (STEMI) myocardial infarction of unspecified site: Secondary | ICD-10-CM | POA: Diagnosis not present

## 2018-12-23 DIAGNOSIS — I2510 Atherosclerotic heart disease of native coronary artery without angina pectoris: Secondary | ICD-10-CM | POA: Diagnosis not present

## 2018-12-23 DIAGNOSIS — I214 Non-ST elevation (NSTEMI) myocardial infarction: Secondary | ICD-10-CM | POA: Diagnosis not present

## 2018-12-23 DIAGNOSIS — I509 Heart failure, unspecified: Secondary | ICD-10-CM | POA: Diagnosis not present

## 2018-12-23 DIAGNOSIS — E119 Type 2 diabetes mellitus without complications: Secondary | ICD-10-CM | POA: Diagnosis present

## 2018-12-23 DIAGNOSIS — I255 Ischemic cardiomyopathy: Secondary | ICD-10-CM | POA: Diagnosis present

## 2018-12-23 DIAGNOSIS — Z794 Long term (current) use of insulin: Secondary | ICD-10-CM | POA: Diagnosis not present

## 2018-12-23 DIAGNOSIS — G8929 Other chronic pain: Secondary | ICD-10-CM | POA: Diagnosis not present

## 2018-12-23 DIAGNOSIS — I4439 Other atrioventricular block: Secondary | ICD-10-CM | POA: Diagnosis not present

## 2018-12-23 DIAGNOSIS — I25119 Atherosclerotic heart disease of native coronary artery with unspecified angina pectoris: Secondary | ICD-10-CM | POA: Diagnosis present

## 2018-12-23 DIAGNOSIS — R0902 Hypoxemia: Secondary | ICD-10-CM | POA: Diagnosis not present

## 2018-12-23 DIAGNOSIS — I252 Old myocardial infarction: Secondary | ICD-10-CM | POA: Diagnosis not present

## 2018-12-23 DIAGNOSIS — Z789 Other specified health status: Secondary | ICD-10-CM | POA: Insufficient documentation

## 2018-12-23 DIAGNOSIS — I428 Other cardiomyopathies: Secondary | ICD-10-CM | POA: Diagnosis not present

## 2018-12-23 DIAGNOSIS — R079 Chest pain, unspecified: Secondary | ICD-10-CM | POA: Diagnosis not present

## 2018-12-23 DIAGNOSIS — I110 Hypertensive heart disease with heart failure: Secondary | ICD-10-CM | POA: Diagnosis not present

## 2018-12-23 DIAGNOSIS — E1165 Type 2 diabetes mellitus with hyperglycemia: Secondary | ICD-10-CM | POA: Diagnosis not present

## 2018-12-23 DIAGNOSIS — R110 Nausea: Secondary | ICD-10-CM | POA: Diagnosis not present

## 2018-12-23 DIAGNOSIS — Z955 Presence of coronary angioplasty implant and graft: Secondary | ICD-10-CM | POA: Diagnosis not present

## 2018-12-23 DIAGNOSIS — I10 Essential (primary) hypertension: Secondary | ICD-10-CM | POA: Diagnosis present

## 2018-12-23 DIAGNOSIS — Z7902 Long term (current) use of antithrombotics/antiplatelets: Secondary | ICD-10-CM | POA: Diagnosis not present

## 2018-12-23 DIAGNOSIS — Z8249 Family history of ischemic heart disease and other diseases of the circulatory system: Secondary | ICD-10-CM | POA: Diagnosis not present

## 2018-12-23 DIAGNOSIS — M549 Dorsalgia, unspecified: Secondary | ICD-10-CM | POA: Diagnosis not present

## 2018-12-23 DIAGNOSIS — I517 Cardiomegaly: Secondary | ICD-10-CM | POA: Diagnosis not present

## 2018-12-23 DIAGNOSIS — Z951 Presence of aortocoronary bypass graft: Secondary | ICD-10-CM | POA: Diagnosis not present

## 2018-12-23 DIAGNOSIS — E7849 Other hyperlipidemia: Secondary | ICD-10-CM | POA: Diagnosis not present

## 2018-12-28 ENCOUNTER — Ambulatory Visit: Payer: Medicare Other | Admitting: Podiatry

## 2019-01-03 DIAGNOSIS — E1142 Type 2 diabetes mellitus with diabetic polyneuropathy: Secondary | ICD-10-CM | POA: Diagnosis not present

## 2019-01-03 DIAGNOSIS — S2220XA Unspecified fracture of sternum, initial encounter for closed fracture: Secondary | ICD-10-CM | POA: Diagnosis not present

## 2019-01-03 DIAGNOSIS — G8929 Other chronic pain: Secondary | ICD-10-CM | POA: Diagnosis not present

## 2019-01-03 DIAGNOSIS — I214 Non-ST elevation (NSTEMI) myocardial infarction: Secondary | ICD-10-CM | POA: Diagnosis not present

## 2019-01-03 DIAGNOSIS — Z79899 Other long term (current) drug therapy: Secondary | ICD-10-CM | POA: Diagnosis not present

## 2019-01-03 DIAGNOSIS — E291 Testicular hypofunction: Secondary | ICD-10-CM | POA: Diagnosis not present

## 2019-01-03 DIAGNOSIS — E785 Hyperlipidemia, unspecified: Secondary | ICD-10-CM | POA: Diagnosis not present

## 2019-01-03 DIAGNOSIS — M5137 Other intervertebral disc degeneration, lumbosacral region: Secondary | ICD-10-CM | POA: Diagnosis not present

## 2019-01-12 DIAGNOSIS — I252 Old myocardial infarction: Secondary | ICD-10-CM | POA: Diagnosis not present

## 2019-01-12 DIAGNOSIS — I10 Essential (primary) hypertension: Secondary | ICD-10-CM | POA: Diagnosis not present

## 2019-01-12 DIAGNOSIS — Z951 Presence of aortocoronary bypass graft: Secondary | ICD-10-CM | POA: Diagnosis not present

## 2019-01-12 DIAGNOSIS — E785 Hyperlipidemia, unspecified: Secondary | ICD-10-CM | POA: Diagnosis not present

## 2019-01-12 DIAGNOSIS — I420 Dilated cardiomyopathy: Secondary | ICD-10-CM | POA: Diagnosis not present

## 2019-01-12 DIAGNOSIS — Z955 Presence of coronary angioplasty implant and graft: Secondary | ICD-10-CM | POA: Diagnosis not present

## 2019-01-12 DIAGNOSIS — I255 Ischemic cardiomyopathy: Secondary | ICD-10-CM | POA: Diagnosis not present

## 2019-01-12 DIAGNOSIS — I2510 Atherosclerotic heart disease of native coronary artery without angina pectoris: Secondary | ICD-10-CM | POA: Diagnosis not present

## 2019-01-17 DIAGNOSIS — E291 Testicular hypofunction: Secondary | ICD-10-CM | POA: Diagnosis not present

## 2019-01-18 ENCOUNTER — Ambulatory Visit (INDEPENDENT_AMBULATORY_CARE_PROVIDER_SITE_OTHER): Payer: Medicare Other | Admitting: Podiatry

## 2019-01-18 ENCOUNTER — Other Ambulatory Visit: Payer: Self-pay

## 2019-01-18 DIAGNOSIS — Z5329 Procedure and treatment not carried out because of patient's decision for other reasons: Secondary | ICD-10-CM

## 2019-01-18 NOTE — Progress Notes (Signed)
   Complete physical exam  Patient: Alejandro Taylor   DOB: 01/04/1998   58 y.o. Male  MRN: [id number]  Subjective:    No chief complaint on file.   Alejandro Taylor is a 58 y.o. male who presents today for a complete physical exam. She reports consuming a {diet types:17450} diet. {types:19826} She generally feels {DESC; WELL/FAIRLY WELL/POORLY:18703}. She reports sleeping {DESC; WELL/FAIRLY WELL/POORLY:18703}. She {does/does not:200015} have additional problems to discuss today.    Most recent fall risk assessment:    09/11/2021   10:42 AM  Fall Risk   Falls in the past year? 0  Number falls in past yr: 0  Injury with Fall? 0  Risk for fall due to : No Fall Risks  Follow up Falls evaluation completed     Most recent depression screenings:    09/11/2021   10:42 AM 08/02/2020   10:46 AM  PHQ 2/9 Scores  PHQ - 2 Score 0 0  PHQ- 9 Score 5     {VISON DENTAL STD PSA (Optional):27386}  {History (Optional):23778}  Patient Care Team: Jessup, Joy, NP as PCP - General (Nurse Practitioner)   Outpatient Medications Prior to Visit  Medication Sig   fluticasone (FLONASE) 50 MCG/ACT nasal spray Place 2 sprays into both nostrils in the morning and at bedtime. After 7 days, reduce to once daily.   norgestimate-ethinyl estradiol (SPRINTEC 28) 0.25-35 MG-MCG tablet Take 1 tablet by mouth daily.   Nystatin POWD Apply liberally to affected area 2 times per day   spironolactone (ALDACTONE) 100 MG tablet Take 1 tablet (100 mg total) by mouth daily.   No facility-administered medications prior to visit.    ROS        Objective:     There were no vitals taken for this visit. {Vitals History (Optional):23777}  Physical Exam   No results found for any visits on 10/17/21. {Show previous labs (optional):23779}    Assessment & Plan:    Routine Health Maintenance and Physical Exam  Immunization History  Administered Date(s) Administered   DTaP 03/20/1999, 05/16/1999,  07/25/1999, 04/09/2000, 10/24/2003   Hepatitis A 08/20/2007, 08/25/2008   Hepatitis B 01/05/1999, 02/12/1999, 07/25/1999   HiB (PRP-OMP) 03/20/1999, 05/16/1999, 07/25/1999, 04/09/2000   IPV 03/20/1999, 05/16/1999, 01/13/2000, 10/24/2003   Influenza,inj,Quad PF,6+ Mos 11/25/2013   Influenza-Unspecified 02/25/2012   MMR 01/12/2001, 10/24/2003   Meningococcal Polysaccharide 08/25/2011   Pneumococcal Conjugate-13 04/09/2000   Pneumococcal-Unspecified 07/25/1999, 10/08/1999   Tdap 08/25/2011   Varicella 01/13/2000, 08/20/2007    Health Maintenance  Topic Date Due   HIV Screening  Never done   Hepatitis C Screening  Never done   INFLUENZA VACCINE  10/15/2021   PAP-Cervical Cytology Screening  10/17/2021 (Originally 01/04/2020)   PAP SMEAR-Modifier  10/17/2021 (Originally 01/04/2020)   TETANUS/TDAP  10/17/2021 (Originally 08/24/2021)   HPV VACCINES  Discontinued   COVID-19 Vaccine  Discontinued    Discussed health benefits of physical activity, and encouraged her to engage in regular exercise appropriate for her age and condition.  Problem List Items Addressed This Visit   None Visit Diagnoses     Annual physical exam    -  Primary   Cervical cancer screening       Need for Tdap vaccination          No follow-ups on file.     Joy Jessup, NP   

## 2019-02-03 DIAGNOSIS — M5137 Other intervertebral disc degeneration, lumbosacral region: Secondary | ICD-10-CM | POA: Diagnosis not present

## 2019-02-03 DIAGNOSIS — E291 Testicular hypofunction: Secondary | ICD-10-CM | POA: Diagnosis not present

## 2019-02-03 DIAGNOSIS — I10 Essential (primary) hypertension: Secondary | ICD-10-CM | POA: Diagnosis not present

## 2019-02-03 DIAGNOSIS — S2220XA Unspecified fracture of sternum, initial encounter for closed fracture: Secondary | ICD-10-CM | POA: Diagnosis not present

## 2019-02-03 DIAGNOSIS — G8929 Other chronic pain: Secondary | ICD-10-CM | POA: Diagnosis not present

## 2019-02-14 DIAGNOSIS — E291 Testicular hypofunction: Secondary | ICD-10-CM | POA: Diagnosis not present

## 2019-02-28 DIAGNOSIS — G8929 Other chronic pain: Secondary | ICD-10-CM | POA: Diagnosis not present

## 2019-02-28 DIAGNOSIS — M5137 Other intervertebral disc degeneration, lumbosacral region: Secondary | ICD-10-CM | POA: Diagnosis not present

## 2019-02-28 DIAGNOSIS — S2220XA Unspecified fracture of sternum, initial encounter for closed fracture: Secondary | ICD-10-CM | POA: Diagnosis not present

## 2019-02-28 DIAGNOSIS — E291 Testicular hypofunction: Secondary | ICD-10-CM | POA: Diagnosis not present

## 2019-02-28 DIAGNOSIS — I10 Essential (primary) hypertension: Secondary | ICD-10-CM | POA: Diagnosis not present

## 2019-03-14 DIAGNOSIS — E291 Testicular hypofunction: Secondary | ICD-10-CM | POA: Diagnosis not present

## 2019-03-24 ENCOUNTER — Telehealth: Payer: Self-pay | Admitting: Podiatry

## 2019-03-24 NOTE — Telephone Encounter (Signed)
Pt left message yesterday asking about the diabetic shoes that was returned last year because it was coming apart at the seam after a couple of months. He returned to [facility] several months ago and has not heard anything.  I checked with [employer] and they are on back order until possibly march.  I called pt and he is wanting to come in and pick out a new pair for replacement.  I told him while he is here he should go ahead and proceed with the new shoes for 2021. He is scheduled to see Liliane Channel in [place] 1.13.2021 and is scheduled to see Dr March Rummage 1.19.2021 in [place].

## 2019-03-28 DIAGNOSIS — S2220XA Unspecified fracture of sternum, initial encounter for closed fracture: Secondary | ICD-10-CM | POA: Diagnosis not present

## 2019-03-28 DIAGNOSIS — E291 Testicular hypofunction: Secondary | ICD-10-CM | POA: Diagnosis not present

## 2019-03-28 DIAGNOSIS — I10 Essential (primary) hypertension: Secondary | ICD-10-CM | POA: Diagnosis not present

## 2019-03-28 DIAGNOSIS — M5137 Other intervertebral disc degeneration, lumbosacral region: Secondary | ICD-10-CM | POA: Diagnosis not present

## 2019-03-28 DIAGNOSIS — G8929 Other chronic pain: Secondary | ICD-10-CM | POA: Diagnosis not present

## 2019-03-30 ENCOUNTER — Other Ambulatory Visit: Payer: Self-pay

## 2019-03-30 ENCOUNTER — Other Ambulatory Visit: Payer: Medicare Other | Admitting: Orthotics

## 2019-04-05 ENCOUNTER — Other Ambulatory Visit: Payer: Self-pay

## 2019-04-05 ENCOUNTER — Ambulatory Visit (INDEPENDENT_AMBULATORY_CARE_PROVIDER_SITE_OTHER): Payer: Medicare Other | Admitting: Podiatry

## 2019-04-05 ENCOUNTER — Encounter: Payer: Self-pay | Admitting: Podiatry

## 2019-04-05 DIAGNOSIS — M7751 Other enthesopathy of right foot: Secondary | ICD-10-CM | POA: Diagnosis not present

## 2019-04-05 DIAGNOSIS — G588 Other specified mononeuropathies: Secondary | ICD-10-CM

## 2019-04-05 DIAGNOSIS — M7752 Other enthesopathy of left foot: Secondary | ICD-10-CM

## 2019-04-05 NOTE — Progress Notes (Signed)
  Subjective:  Patient ID: Alejandro Taylor, male    DOB: [birth year],  MRN: [id number]  Chief Complaint  Patient presents with  . Foot Problem    the right foot hurts and the bunion does hurt     59 y.o. male presents with the above complaint. History confirmed with patient.   Objective:  Physical Exam: warm, good capillary refill, no trophic changes or ulcerative lesions, normal DP and PT pulses and normal sensory exam.   Right Foot: tenderness of the 5th metatarsal head and tenderness between the 2nd and 3rd metatarsal head   Assessment:   1. Capsulitis of metatarsophalangeal (MTP) joint of left foot   2. Interdigital neuroma    Plan:  Patient was evaluated and treated and all questions answered.  Capsulitis Right 5th MPJ -Injection delivered to the painful joint  Procedure: Joint Injection Location: Right 5th MP joint Skin Prep: Alcohol. Injectate: 0.5 cc 1% lidocaine plain, 0.5 cc dexamethasone phosphate. Disposition: Patient tolerated procedure well. Injection site dressed with a band-aid.  Interdigital Neuroma, Right -Injection delivered to the affected interspaces  Procedure: Neuroma Injection Location: Right 2nd interspace Skin Prep: Alcohol. Injectate: 0.5 cc 0.5% marcaine plain, 0.5 cc celestone. Disposition: Patient tolerated procedure well. Injection site dressed with a band-aid.  Return in about 3 weeks (around 04/26/2019) for Neuroma.

## 2019-04-06 DIAGNOSIS — S71111A Laceration without foreign body, right thigh, initial encounter: Secondary | ICD-10-CM | POA: Diagnosis not present

## 2019-04-06 DIAGNOSIS — Z23 Encounter for immunization: Secondary | ICD-10-CM | POA: Diagnosis not present

## 2019-04-11 DIAGNOSIS — E291 Testicular hypofunction: Secondary | ICD-10-CM | POA: Diagnosis not present

## 2019-04-25 ENCOUNTER — Ambulatory Visit: Payer: Medicare Other | Admitting: Podiatry

## 2019-04-25 DIAGNOSIS — J449 Chronic obstructive pulmonary disease, unspecified: Secondary | ICD-10-CM | POA: Diagnosis not present

## 2019-04-25 DIAGNOSIS — E785 Hyperlipidemia, unspecified: Secondary | ICD-10-CM | POA: Diagnosis not present

## 2019-04-25 DIAGNOSIS — Z125 Encounter for screening for malignant neoplasm of prostate: Secondary | ICD-10-CM | POA: Diagnosis not present

## 2019-04-25 DIAGNOSIS — E1165 Type 2 diabetes mellitus with hyperglycemia: Secondary | ICD-10-CM | POA: Diagnosis not present

## 2019-04-25 DIAGNOSIS — Z79899 Other long term (current) drug therapy: Secondary | ICD-10-CM | POA: Diagnosis not present

## 2019-04-25 DIAGNOSIS — E291 Testicular hypofunction: Secondary | ICD-10-CM | POA: Diagnosis not present

## 2019-04-25 DIAGNOSIS — I10 Essential (primary) hypertension: Secondary | ICD-10-CM | POA: Diagnosis not present

## 2019-04-25 DIAGNOSIS — E1142 Type 2 diabetes mellitus with diabetic polyneuropathy: Secondary | ICD-10-CM | POA: Diagnosis not present

## 2019-04-26 ENCOUNTER — Telehealth: Payer: Self-pay | Admitting: Podiatry

## 2019-04-26 NOTE — Telephone Encounter (Signed)
Pt called stating he missed his appt yesterday due to his copd and I r/s it for next week. Pt concerned about the no show charge. He asked if could be waived since he was sick and it was out of his control.

## 2019-04-28 NOTE — Telephone Encounter (Signed)
Can we let him know we will waive the no show fee and I hope he feels better.

## 2019-04-29 ENCOUNTER — Other Ambulatory Visit: Payer: Self-pay

## 2019-04-29 DIAGNOSIS — E119 Type 2 diabetes mellitus without complications: Secondary | ICD-10-CM | POA: Insufficient documentation

## 2019-05-02 ENCOUNTER — Ambulatory Visit: Payer: Medicare Other | Admitting: Podiatry

## 2019-05-06 DIAGNOSIS — E119 Type 2 diabetes mellitus without complications: Secondary | ICD-10-CM | POA: Diagnosis not present

## 2019-05-09 ENCOUNTER — Ambulatory Visit (INDEPENDENT_AMBULATORY_CARE_PROVIDER_SITE_OTHER): Payer: Medicare Other | Admitting: Podiatry

## 2019-05-09 ENCOUNTER — Other Ambulatory Visit: Payer: Self-pay

## 2019-05-09 DIAGNOSIS — G588 Other specified mononeuropathies: Secondary | ICD-10-CM

## 2019-05-09 DIAGNOSIS — E291 Testicular hypofunction: Secondary | ICD-10-CM | POA: Diagnosis not present

## 2019-05-09 DIAGNOSIS — L600 Ingrowing nail: Secondary | ICD-10-CM

## 2019-05-09 DIAGNOSIS — M7752 Other enthesopathy of left foot: Secondary | ICD-10-CM

## 2019-05-09 NOTE — Progress Notes (Signed)
  Subjective:  Patient ID: Alejandro Taylor, male    DOB: 04-Mar-1961,  MRN: [id number]  Chief Complaint  Patient presents with  . capsulitis    F/u Rt capsulitis and neuroma Pt. states," the pain is back on my rt foot. The shot takes the pain for 1-2 wks. Also, I feel and ingrown at my Rt medial side." tx: none   . Diabetes    BFS: 27    59 y.o. male presents with the above complaint. History confirmed with patient.   Objective:  Physical Exam: warm, good capillary refill, no trophic changes or ulcerative lesions, normal DP and PT pulses and normal sensory exam. Left Foot: normal exam, no swelling, tenderness, instability; ligaments intact, full range of motion of all ankle/foot joints  Right Foot: medial hallux ingrown nail, POP right 2nd IS, pain R 5th MPJ   Assessment:   1. Ingrown nail   2. Capsulitis of metatarsophalangeal (MTP) joint of left foot   3. Interdigital neuroma     Plan:  Patient was evaluated and treated and all questions answered.  Ingrown Nail -Gently debrided in slant back fashion to patient relief.  Neuroma 2nd IS Right, Capsulitis R 5th MPJ -Avoid injection today given recent steriods for COPD  Return in about 3 weeks (around 05/30/2019) for Capsulitis, ingrown nail.

## 2019-05-16 DIAGNOSIS — Z951 Presence of aortocoronary bypass graft: Secondary | ICD-10-CM | POA: Diagnosis not present

## 2019-05-16 DIAGNOSIS — I252 Old myocardial infarction: Secondary | ICD-10-CM | POA: Diagnosis not present

## 2019-05-16 DIAGNOSIS — I255 Ischemic cardiomyopathy: Secondary | ICD-10-CM | POA: Diagnosis not present

## 2019-05-16 DIAGNOSIS — E782 Mixed hyperlipidemia: Secondary | ICD-10-CM | POA: Diagnosis not present

## 2019-05-16 DIAGNOSIS — I420 Dilated cardiomyopathy: Secondary | ICD-10-CM | POA: Diagnosis not present

## 2019-05-16 DIAGNOSIS — Z955 Presence of coronary angioplasty implant and graft: Secondary | ICD-10-CM | POA: Diagnosis not present

## 2019-05-16 DIAGNOSIS — I2510 Atherosclerotic heart disease of native coronary artery without angina pectoris: Secondary | ICD-10-CM | POA: Diagnosis not present

## 2019-05-16 DIAGNOSIS — I10 Essential (primary) hypertension: Secondary | ICD-10-CM | POA: Diagnosis not present

## 2019-05-24 DIAGNOSIS — I110 Hypertensive heart disease with heart failure: Secondary | ICD-10-CM | POA: Diagnosis not present

## 2019-05-24 DIAGNOSIS — G4700 Insomnia, unspecified: Secondary | ICD-10-CM | POA: Diagnosis not present

## 2019-05-24 DIAGNOSIS — F419 Anxiety disorder, unspecified: Secondary | ICD-10-CM | POA: Diagnosis not present

## 2019-05-24 DIAGNOSIS — L739 Follicular disorder, unspecified: Secondary | ICD-10-CM | POA: Diagnosis not present

## 2019-05-24 DIAGNOSIS — I252 Old myocardial infarction: Secondary | ICD-10-CM | POA: Diagnosis not present

## 2019-05-24 DIAGNOSIS — E669 Obesity, unspecified: Secondary | ICD-10-CM | POA: Diagnosis not present

## 2019-05-24 DIAGNOSIS — G8929 Other chronic pain: Secondary | ICD-10-CM | POA: Diagnosis not present

## 2019-05-24 DIAGNOSIS — I25118 Atherosclerotic heart disease of native coronary artery with other forms of angina pectoris: Secondary | ICD-10-CM | POA: Diagnosis not present

## 2019-05-24 DIAGNOSIS — J449 Chronic obstructive pulmonary disease, unspecified: Secondary | ICD-10-CM | POA: Diagnosis not present

## 2019-05-24 DIAGNOSIS — E1165 Type 2 diabetes mellitus with hyperglycemia: Secondary | ICD-10-CM | POA: Diagnosis not present

## 2019-05-24 DIAGNOSIS — I509 Heart failure, unspecified: Secondary | ICD-10-CM | POA: Diagnosis not present

## 2019-05-24 DIAGNOSIS — E291 Testicular hypofunction: Secondary | ICD-10-CM | POA: Diagnosis not present

## 2019-05-28 DIAGNOSIS — J0191 Acute recurrent sinusitis, unspecified: Secondary | ICD-10-CM | POA: Diagnosis not present

## 2019-05-28 DIAGNOSIS — Z87891 Personal history of nicotine dependence: Secondary | ICD-10-CM | POA: Diagnosis not present

## 2019-05-31 ENCOUNTER — Ambulatory Visit (INDEPENDENT_AMBULATORY_CARE_PROVIDER_SITE_OTHER): Payer: Medicare Other | Admitting: Podiatry

## 2019-05-31 ENCOUNTER — Other Ambulatory Visit: Payer: Self-pay

## 2019-05-31 ENCOUNTER — Encounter: Payer: Self-pay | Admitting: Podiatry

## 2019-05-31 DIAGNOSIS — L600 Ingrowing nail: Secondary | ICD-10-CM

## 2019-05-31 DIAGNOSIS — M2042 Other hammer toe(s) (acquired), left foot: Secondary | ICD-10-CM

## 2019-05-31 DIAGNOSIS — E1142 Type 2 diabetes mellitus with diabetic polyneuropathy: Secondary | ICD-10-CM

## 2019-05-31 DIAGNOSIS — M2041 Other hammer toe(s) (acquired), right foot: Secondary | ICD-10-CM

## 2019-05-31 MED ORDER — NEOMYCIN-POLYMYXIN-HC 3.5-10000-1 OT SOLN: OTIC | 0 refills | Status: DC

## 2019-05-31 NOTE — Patient Instructions (Signed)

## 2019-05-31 NOTE — Progress Notes (Signed)
  Subjective:  Patient ID: Alejandro Taylor, male    DOB: [birth year],  MRN: [id number]  Chief Complaint  Patient presents with  . debride    F/U Rt capsulitis and nail check Pt. states foot is still feeling the same with no improvment and toenail is still bothering     59 y.o. male presents with the above complaint. History confirmed with patient.   Objective:  Physical Exam: warm, good capillary refill, no trophic changes or ulcerative lesions, normal DP and PT pulses and decreased light touch lack of protective sensation. Hammertoes bilat. Painful ingrowing nail at  medial border of the right, hallux; without warmth, erythema or drainage  Assessment:   1. Ingrown nail   2. DM type 2 with diabetic peripheral neuropathy (HCC)   3. Hammer toes of both feet      Plan:  Patient was evaluated and treated and all questions answered.  Ingrown Nail, right -Patient elects to proceed with ingrown toenail removal today -Ingrown nail excised. See procedure note. -Educated on post-procedure care including soaking. Written instructions provided. -Rx for Cortisporin drops -Patient to follow up in 2 weeks for nail check.  Procedure: Excision of Ingrown Toenail Location: Right 1st toe medial nail borders. Anesthesia: Lidocaine 1% plain; 1.22mL and Marcaine 0.5% plain; 1.40mL, digital block. Skin Prep: Betadine. Dressing: Silvadene; telfa; dry, sterile, compression dressing. Technique: Following skin prep, the toe was exsanguinated and a tourniquet was secured at the base of the toe. The affected nail border was freed, split with a nail splitter, and excised. Chemical matrixectomy was then performed with phenol and irrigated out with alcohol. The tourniquet was then removed and sterile dressing applied. Disposition: Patient tolerated procedure well. Patient to return in 2 weeks for follow-up.  DM with Neuropathy, Hammertoes -Shoes dispensed today. Went over wearing instructions.  Return in  about 2 weeks (around 06/14/2019) for Nail Check.

## 2019-06-01 DIAGNOSIS — I700 Atherosclerosis of aorta: Secondary | ICD-10-CM | POA: Diagnosis not present

## 2019-06-14 ENCOUNTER — Other Ambulatory Visit: Payer: Self-pay

## 2019-06-14 ENCOUNTER — Ambulatory Visit (INDEPENDENT_AMBULATORY_CARE_PROVIDER_SITE_OTHER): Payer: Medicare Other | Admitting: Podiatry

## 2019-06-14 DIAGNOSIS — M7751 Other enthesopathy of right foot: Secondary | ICD-10-CM

## 2019-06-14 DIAGNOSIS — L600 Ingrowing nail: Secondary | ICD-10-CM

## 2019-06-14 NOTE — Progress Notes (Signed)
  Subjective:  Patient ID: Alejandro Taylor, male    DOB: 1961-01-20,  MRN: [id number]  Chief Complaint  Patient presents with  . nail check    F?U Rt hallux nail checK pt. states," nail is good." -pt dneis redness/swellgin/drainage Tx: none     59 y.o. male presents for follow up of nail procedure. History confirmed with patient. Also complains of pain in the joint behind the 3rd and 5th toes.  Objective:  Physical Exam: Ingrown nail avulsion site: overlying soft crust, no warmth, no drainage and no erythema right hallux  POP R 3rd MPJ, 5th MPJ Assessment:   1. Ingrown nail   2. Capsulitis of metatarsophalangeal (MTP) joint of right foot      Plan:  Patient was evaluated and treated and all questions answered.  S/p Ingrown Toenail Excision, right -Healing well without issue. -Discussed return precautions. -F/u PRN  Capsulitis R 3rd/5th MPJ -Injections as below  Procedure: Joint Injection Location: Right 3rd, 5th MP joint Skin Prep: Alcohol. Injectate: 0.5 cc 1% lidocaine plain, 0.5 cc dexamethasone phosphate. Disposition: Patient tolerated procedure well. Injection site dressed with a band-aid.

## 2019-06-21 DIAGNOSIS — Z79899 Other long term (current) drug therapy: Secondary | ICD-10-CM | POA: Diagnosis not present

## 2019-06-23 DIAGNOSIS — Z794 Long term (current) use of insulin: Secondary | ICD-10-CM | POA: Diagnosis not present

## 2019-06-23 DIAGNOSIS — E11649 Type 2 diabetes mellitus with hypoglycemia without coma: Secondary | ICD-10-CM | POA: Diagnosis not present

## 2019-07-19 DIAGNOSIS — Z23 Encounter for immunization: Secondary | ICD-10-CM | POA: Diagnosis not present

## 2019-07-20 DIAGNOSIS — R61 Generalized hyperhidrosis: Secondary | ICD-10-CM | POA: Diagnosis not present

## 2019-07-20 DIAGNOSIS — I10 Essential (primary) hypertension: Secondary | ICD-10-CM | POA: Diagnosis not present

## 2019-07-20 DIAGNOSIS — R0902 Hypoxemia: Secondary | ICD-10-CM | POA: Diagnosis not present

## 2019-08-16 DIAGNOSIS — Z23 Encounter for immunization: Secondary | ICD-10-CM | POA: Diagnosis not present

## 2019-09-26 ENCOUNTER — Ambulatory Visit (INDEPENDENT_AMBULATORY_CARE_PROVIDER_SITE_OTHER): Payer: Medicare Other | Admitting: Podiatry

## 2019-09-26 ENCOUNTER — Other Ambulatory Visit: Payer: Self-pay

## 2019-09-26 DIAGNOSIS — M7751 Other enthesopathy of right foot: Secondary | ICD-10-CM | POA: Diagnosis not present

## 2019-09-26 NOTE — Progress Notes (Signed)
  Subjective:  Patient ID: Alejandro Taylor, male    DOB: 1960-12-29,  MRN: [id number]  No chief complaint on file.   59 y.o. male presents for f/u of right foot pain. Having pain in the bone behind the 2nd toe and the 5th toe.   Currently working through going down on his pain medication and is experiencing withdrawal symptoms.  Objective:  Physical Exam: warm, good capillary refill, no trophic changes or ulcerative lesions, normal DP and PT pulses and normal sensory exam. Right Foot: POP right 2nd/5th MPJ  No images are attached to the encounter.  Assessment:   1. Capsulitis of metatarsophalangeal (MTP) joint of right foot    Plan:  Patient was evaluated and treated and all questions answered.  Capsulitis -Injection delivered to the painful joint  Procedure: Joint Injection Location: Right 2nd/5th joint Skin Prep: Alcohol. Injectate: 0.5 cc 1% lidocaine plain, 0.5 cc dexamethasone phosphate. Disposition: Patient tolerated procedure well. Injection site dressed with a band-aid.   No follow-ups on file.

## 2019-09-27 ENCOUNTER — Telehealth: Payer: Self-pay | Admitting: Podiatry

## 2019-09-27 NOTE — Telephone Encounter (Signed)
I have some questions about billing and I need to inform you that your billing department has not been charging my Medicaid, only the [employer]. I made some payments that I need to resolve. My number is [phone].   My Medicaid number is [id number] Q

## 2019-11-24 ENCOUNTER — Ambulatory Visit: Payer: Medicare Other | Admitting: Podiatry

## 2019-12-05 ENCOUNTER — Other Ambulatory Visit: Payer: Self-pay

## 2019-12-05 ENCOUNTER — Ambulatory Visit (INDEPENDENT_AMBULATORY_CARE_PROVIDER_SITE_OTHER): Payer: Medicare Other | Admitting: Podiatry

## 2019-12-05 ENCOUNTER — Encounter: Payer: Self-pay | Admitting: Podiatry

## 2019-12-05 DIAGNOSIS — L600 Ingrowing nail: Secondary | ICD-10-CM | POA: Diagnosis not present

## 2019-12-05 NOTE — Progress Notes (Signed)
  Subjective:  Patient ID: Alejandro Taylor, male    DOB: 1960-05-14,  MRN: [id number]  Chief Complaint  Patient presents with  . Ingrown Toenail    ingrown on the left big toe     59 y.o. male presents with the above complaint. History confirmed with patient.   Objective:  Physical Exam: warm, good capillary refill, no trophic changes or ulcerative lesions, normal DP and PT pulses and decreased sensation to light touch. Painful ingrowing nail at  medial border of the left, hallux; without warmth, erythema or drainage  Assessment:   1. Ingrown nail      Plan:  Patient was evaluated and treated and all questions answered.  Ingrown Nail, left -Patient elects to proceed with ingrown toenail removal today -Ingrown nail excised. See procedure note. -Educated on post-procedure care including soaking. Written instructions provided. -Patient to follow up in 2 weeks for nail check.   Return in about 2 weeks (around 12/19/2019) for Nail Check.

## 2019-12-05 NOTE — Patient Instructions (Signed)

## 2019-12-19 ENCOUNTER — Ambulatory Visit: Payer: Medicare Other | Admitting: Podiatry

## 2019-12-22 ENCOUNTER — Ambulatory Visit: Payer: Medicare Other | Admitting: Podiatry

## 2019-12-22 ENCOUNTER — Other Ambulatory Visit: Payer: Self-pay

## 2019-12-22 ENCOUNTER — Ambulatory Visit (INDEPENDENT_AMBULATORY_CARE_PROVIDER_SITE_OTHER): Payer: Medicare Other | Admitting: Podiatry

## 2019-12-22 DIAGNOSIS — L600 Ingrowing nail: Secondary | ICD-10-CM

## 2019-12-22 NOTE — Progress Notes (Signed)
  Subjective:  Patient ID: Alejandro Taylor, male    DOB: 06-02-1960,  MRN: [id number]  Chief Complaint  Patient presents with  . Nail Problem    NAIL CHECK-     59 y.o. male presents for follow up of nail procedure. History confirmed with patient. Denies pain, states it feels much better. Denies N/V/F/Ch  Objective:  Physical Exam: Ingrown nail avulsion site: overlying soft crust, no warmth, no drainage and no erythema Assessment:   1. Ingrown nail      Plan:  Patient was evaluated and treated and all questions answered.  S/p Ingrown Toenail Excision, left  -Nail gently debrided with curette. -Healing well without issue. Abx ointment and band-aid applied. Patient to apply qOD. -Discussed return precautions. -F/u PRN

## 2020-03-02 DIAGNOSIS — Z23 Encounter for immunization: Secondary | ICD-10-CM | POA: Diagnosis not present

## 2020-04-12 ENCOUNTER — Other Ambulatory Visit: Payer: Self-pay | Admitting: *Deleted

## 2020-04-12 ENCOUNTER — Other Ambulatory Visit: Payer: Self-pay

## 2020-04-12 ENCOUNTER — Ambulatory Visit (INDEPENDENT_AMBULATORY_CARE_PROVIDER_SITE_OTHER): Payer: Medicare Other | Admitting: Podiatry

## 2020-04-12 DIAGNOSIS — E1142 Type 2 diabetes mellitus with diabetic polyneuropathy: Secondary | ICD-10-CM | POA: Diagnosis not present

## 2020-04-12 DIAGNOSIS — Q828 Other specified congenital malformations of skin: Secondary | ICD-10-CM

## 2020-04-12 NOTE — Progress Notes (Addendum)
  Subjective:  Patient ID: Alejandro Taylor, male    DOB: [birth year],  MRN: [id number]  No chief complaint on file.   60 y.o. male presents with the above complaint. History confirmed with patient. States the bottoms of both feet have hard areas that hurt him.  Objective:  Physical Exam: warm, good capillary refill, nail exam normal nails without lesions, no trophic changes or ulcerative lesions. DP pulses palpable and protective sensation absent Left Foot: punctate keratosis NWB surface proximal to the 4th met  Right Foot: right foot punctate keratosis submet 5   No images are attached to the encounter.  Assessment:   1. DM type 2 with diabetic peripheral neuropathy ([place])   2. Porokeratosis    Plan:  Patient was evaluated and treated and all questions answered.  Porokeratoses -Debrided lesions x2  Procedure: Paring of Lesion Rationale: painful hyperkeratotic lesion Type of Debridement: manual, sharp debridement. Instrumentation: 312 blade Number of Lesions: 2   No follow-ups on file.

## 2021-07-12 ENCOUNTER — Ambulatory Visit (INDEPENDENT_AMBULATORY_CARE_PROVIDER_SITE_OTHER): Payer: Medicare Other | Admitting: Sports Medicine

## 2021-07-12 ENCOUNTER — Ambulatory Visit (INDEPENDENT_AMBULATORY_CARE_PROVIDER_SITE_OTHER): Payer: Medicare Other

## 2021-07-12 ENCOUNTER — Encounter: Payer: Self-pay | Admitting: Sports Medicine

## 2021-07-12 DIAGNOSIS — B351 Tinea unguium: Secondary | ICD-10-CM | POA: Diagnosis not present

## 2021-07-12 DIAGNOSIS — M778 Other enthesopathies, not elsewhere classified: Secondary | ICD-10-CM

## 2021-07-12 DIAGNOSIS — M21969 Unspecified acquired deformity of unspecified lower leg: Secondary | ICD-10-CM

## 2021-07-12 DIAGNOSIS — L600 Ingrowing nail: Secondary | ICD-10-CM

## 2021-07-12 DIAGNOSIS — Q828 Other specified congenital malformations of skin: Secondary | ICD-10-CM

## 2021-07-12 DIAGNOSIS — M79609 Pain in unspecified limb: Secondary | ICD-10-CM | POA: Diagnosis not present

## 2021-07-12 DIAGNOSIS — M79675 Pain in left toe(s): Secondary | ICD-10-CM

## 2021-07-12 DIAGNOSIS — M2041 Other hammer toe(s) (acquired), right foot: Secondary | ICD-10-CM

## 2021-07-12 DIAGNOSIS — M79674 Pain in right toe(s): Secondary | ICD-10-CM

## 2021-07-12 DIAGNOSIS — M2042 Other hammer toe(s) (acquired), left foot: Secondary | ICD-10-CM

## 2021-07-12 DIAGNOSIS — E1142 Type 2 diabetes mellitus with diabetic polyneuropathy: Secondary | ICD-10-CM

## 2021-07-12 NOTE — Progress Notes (Signed)
Subjective: ?Alejandro Taylor is a 61 y.o. male patient with history of diabetes who presents to office today complaining of pain to both feet states that his toes hurt and it feels like they are always numb and sometimes really painful.  Patient also complains of long,mildly painful nails  while ambulating in shoes; unable to trim states that sometimes his big toes digging in and he gets hard skin on the side especially the left big toe.  ? ?Patient wants to be fitted for diabetic shoes states that he has a hard time fitting shoes properly. ? ?Fasting blood sugar not recorded ?Patient Active Problem List  ? Diagnosis Date Noted  ? Diabetes mellitus, type 2 ([place]) 04/29/2019  ? Drug tolerance 12/23/2018  ? Chronic back pain 05/11/2018  ? Hyperlipidemia 05/11/2018  ? GAD (generalized anxiety disorder) 01/06/2018  ? Tear of medial meniscus of knee 06/16/2017  ? Angina pectoris ([place]) 03/24/2017  ? H/O heart artery stent 09/29/2016  ? Old MI (myocardial infarction) 09/29/2016  ? CAD in native artery 04/07/2016  ? Dyslipidemia (high LDL; low HDL) 04/07/2016  ? Essential hypertension 04/07/2016  ? Hx of CABG 04/07/2016  ? Ischemic cardiomyopathy 04/07/2016  ? ?Current Outpatient Medications on File Prior to Visit  ?Medication Sig Dispense Refill  ? lisinopril (ZESTRIL) 20 MG tablet Take 1 tablet by mouth daily.    ? nitroGLYCERIN (NITROSTAT) 0.4 MG SL tablet Place under the tongue.    ? promethazine (PHENERGAN) 25 MG tablet Take by mouth.    ? ALPRAZolam (XANAX) 0.5 MG tablet Take 0.125-0.25 mg by mouth 2 (two) times daily as needed for anxiety.    ? aspirin 81 MG EC tablet Take by mouth.    ? atorvastatin (LIPITOR) 80 MG tablet Take 80 mg by mouth daily.    ? B-D 3CC LUER-LOK SYR 22GX1" 22G X 1" 3 ML MISC Inject into the muscle every 14 (fourteen) days.    ? carvedilol (COREG) 12.5 MG tablet     ? chlorhexidine (PERIDEX) 0.12 % solution 15 mLs 2 (two) times daily.    ? cloNIDine (CATAPRES) 0.1 MG tablet Take 0.1 mg by  mouth 2 (two) times daily.    ? colesevelam (WELCHOL) 625 MG tablet Take 1,875 mg by mouth 2 (two) times daily.    ? fluticasone (FLONASE) 50 MCG/ACT nasal spray Place 1 spray into both nostrils daily.    ? furosemide (LASIX) 40 MG tablet Take 20 mg by mouth daily.    ? insulin glargine (LANTUS) 100 UNIT/ML injection Inject 35 Units into the skin every evening.    ? insulin lispro (HUMALOG) 100 UNIT/ML injection Inject 2-3 Units into the skin every evening.    ? ipratropium-albuterol (DUONEB) 0.5-2.5 (3) MG/3ML SOLN Inhale 3 mLs into the lungs 4 (four) times daily as needed for shortness of breath.    ? isosorbide mononitrate (IMDUR) 30 MG 24 hr tablet     ? JARDIANCE 25 MG TABS tablet Take 25 mg by mouth daily.    ? lisinopril (ZESTRIL) 20 MG tablet Take 20 mg by mouth daily.    ? metoCLOPramide (REGLAN) 10 MG tablet     ? metroNIDAZOLE (METROGEL) 1 % gel Apply topically daily.    ? mupirocin ointment (BACTROBAN) 2 % Apply topically 3 (three) times daily.    ? NARCAN 4 MG/0.1ML LIQD nasal spray kit SMARTSIG:Both Nares    ? nystatin (MYCOSTATIN) 100000 UNIT/ML suspension SMARTSIG:4 Milliliter(s) By Mouth 4 Times Daily    ? nystatin cream (  MYCOSTATIN) Apply 1 application topically daily as needed (skin flares).     ? Omega-3 1000 MG CAPS Take by mouth.    ? omeprazole (PRILOSEC) 40 MG capsule Take 1 capsule by mouth daily.    ? ondansetron (ZOFRAN-ODT) 4 MG disintegrating tablet Take by mouth.    ? oxycodone (ROXICODONE) 30 MG immediate release tablet Take 60 mg by mouth every 4 (four) hours.    ? OXYCONTIN 80 MG 12 hr tablet Take 40 mg by mouth 2 (two) times daily as needed (pain).     ? PARoxetine (PAXIL) 20 MG tablet     ? potassium chloride SA (KLOR-CON) 20 MEQ tablet Take 20 mEq by mouth 2 (two) times daily.    ? pravastatin (PRAVACHOL) 40 MG tablet Take 40 mg by mouth daily.    ? PROAIR HFA 108 (90 Base) MCG/ACT inhaler Inhale 2 puffs into the lungs every 6 (six) hours as needed for shortness of breath.    ?  psyllium (METAMUCIL) 0.52 g capsule Take 0.52 g by mouth 2 (two) times daily.    ? tamsulosin (FLOMAX) 0.4 MG CAPS capsule Take 0.4 mg by mouth daily.    ? Testosterone 20.25 MG/ACT (1.62%) GEL SMARTSIG:2 Pump Topical Daily    ? testosterone cypionate (DEPOTESTOSTERONE CYPIONATE) 200 MG/ML injection Inject 1 dose every 30 days    ? Ticagrelor (BRILINTA PO) Take 45 mg by mouth 2 (two) times daily.    ? Umeclidinium-Vilanterol (ANORO ELLIPTA IN) Inhale into the lungs.    ? UNIFINE PENTIPS 31G X 8 MM MISC USE 1 NEEDLE 4 TIMES DAILY    ? XTAMPZA ER 36 MG C12A     ? ?No current facility-administered medications on file prior to visit.  ? ?Allergies  ?Allergen Reactions  ? Benicar [Olmesartan]   ?  Caused issues with gallbladder   ? Ezetimibe Other (See Comments)  ?  Muscle weakness.  ? Latex Itching and Swelling  ? Moxifloxacin   ?  Unknown   ? Prednisone Swelling  ?  Not allergic to prednisolone   ? Rosuvastatin   ?  Leg cramps   ? ? ?No results found for this or any previous visit (from the past 2160 hour(s)). ? ?Objective: ?General: Patient is awake, alert, and oriented x 3 and in no acute distress. ? ?Integument: Skin is warm, dry and supple bilateral. Nails are tender, long, thickened and  ?dystrophic with subungual debris, consistent with onychomycosis, 1-5 bilateral.  There is minimal reactive keratosis noted to the left hallux distal tuft and medial first MPJs bilateral.  No signs of infection. Remaining integument unremarkable. ? ?Vasculature:  Dorsalis Pedis pulse 1/4 bilateral. Posterior Tibial pulse 1/4 bilateral.  ?Capillary fill time <3 sec 1-5 bilateral. Positive hair growth to the level of the digits. ?Temperature gradient within normal limits. No varicosities present bilateral. No edema present bilateral.  ? ?Neurology: The patient has intact sensation measured with a 5.07/10g Semmes Weinstein Monofilament at all pedal sites bilateral . Vibratory sensation diminished bilateral with tuning fork. No  Babinski sign present bilateral.  ? ?Musculoskeletal: Asymptomatic hammertoes and prominent metatarsal heads pedal deformities noted bilateral. Muscular strength 5/5 in all lower extremity muscular groups bilateral without pain on range of motion . No tenderness with calf compression bilateral. ? ?X-rays no acute fracture or dislocation.  There is hammertoe deformity noted bilateral. ? ?Assessment and Plan: ?Problem List Items Addressed This Visit   ?None ?Visit Diagnoses   ? ? Capsulitis of foot, unspecified laterality    -  Primary  ? Relevant Orders  ? DG Foot Complete Right  ? DG Foot Complete Left  ? Pain due to onychomycosis of nail      ? Ingrown nail      ? Porokeratosis      ? Toe pain, bilateral      ? Diabetic polyneuropathy associated with type 2 diabetes mellitus ([place])      ? Relevant Medications  ? aspirin 81 MG EC tablet  ? atorvastatin (LIPITOR) 80 MG tablet  ? JARDIANCE 25 MG TABS tablet  ? lisinopril (ZESTRIL) 20 MG tablet  ? ?  ? ? ?-Examined patient. ?-X-rays reviewed ?-Discussed and educated patient on diabetic foot care, especially with  ?regards to the vascular, neurological and musculoskeletal systems.  ?-Stressed the importance of good glycemic control and the detriment of not  ?controlling glucose levels in relation to the foot. ?-Mechanically debrided all painful nails 1-5 bilateral using sterile nail nipper and filed with dremel without incident  ?-Mechanically pared reactive keratosis at left great toe and medial first MPJs using a sterile 15 blade without incident ?-Order placed for diabetic shoes and offloading insoles ?-Educated patient on peripheral neuropathy and encourage glycemic control as above ?-Answered all patient questions ?-Patient to return  in 3 months for at risk foot care and as scheduled for diabetic shoe measurements ?-Patient advised to call the office if any problems or questions arise in the meantime. ? ?Landis Martins, DPM ?

## 2021-08-14 ENCOUNTER — Ambulatory Visit: Payer: Medicare Other

## 2021-08-14 DIAGNOSIS — M2041 Other hammer toe(s) (acquired), right foot: Secondary | ICD-10-CM

## 2021-08-14 DIAGNOSIS — E1142 Type 2 diabetes mellitus with diabetic polyneuropathy: Secondary | ICD-10-CM

## 2021-08-14 NOTE — Progress Notes (Signed)
SITUATION Reason for Consult: Evaluation for Prefabricated Diabetic Shoes and Custom Diabetic Inserts. Patient / Caregiver Report: Patient would like well fitting shoes  OBJECTIVE DATA: Patient History / Diagnosis:    ICD-10-CM   1. Diabetic polyneuropathy associated with type 2 diabetes mellitus (HCC)  E11.42     2. Hammer toes of both feet  M20.41    M20.42       Physician Treating Diabetes:  Wynelle Fanny, DO  Current or Previous Devices:   None and no history  In-Person Foot Examination: Ulcers & Callousing:   None Deformities:    Hammertoes Sensation:    Compromised  Shoe Size:     11.5W  ORTHOTIC RECOMMENDATION Recommended Devices: - 1x pair prefabricated PDAC approved diabetic shoes; Patient Selected [id number] Size 11.5W - 3x pair custom-to-patient PDAC approved vacuum formed diabetic insoles.  GOALS OF SHOES AND INSOLES - Reduce shear and pressure - Reduce / Prevent callus formation - Reduce / Prevent ulceration - Protect the fragile healing compromised diabetic foot.  Patient would benefit from diabetic shoes and inserts as patient has diabetes mellitus and the patient has one or more of the following conditions: - History of partial or complete amputation of the foot - History of previous foot ulceration. - History of pre-ulcerative callus - Peripheral neuropathy with evidence of callus formation - Foot deformity - Poor circulation  ACTIONS PERFORMED Potential out of pocket cost was communicated to patient. Patient understood and consented to measurement and casting. Patient was casted for insoles via crush box and measured for shoes via brannock device. Procedure was explained and patient tolerated procedure well. All questions were answered and concerns addressed. Casts were shipped to central fabrication for HOLD until Certificate of Medical Necessity or otherwise necessary authorization from insurance is obtained.  PLAN Shoes are to be ordered and casts released  from hold once all appropriate paperwork is complete. Patient is to be contacted and scheduled for fitting once shoes and insoles have been fabricated and received.

## 2021-10-07 ENCOUNTER — Ambulatory Visit (INDEPENDENT_AMBULATORY_CARE_PROVIDER_SITE_OTHER): Payer: Medicare Other | Admitting: Podiatry

## 2021-10-07 ENCOUNTER — Encounter: Payer: Self-pay | Admitting: Podiatry

## 2021-10-07 DIAGNOSIS — E1142 Type 2 diabetes mellitus with diabetic polyneuropathy: Secondary | ICD-10-CM | POA: Diagnosis not present

## 2021-10-07 DIAGNOSIS — M79609 Pain in unspecified limb: Secondary | ICD-10-CM | POA: Diagnosis not present

## 2021-10-07 DIAGNOSIS — L84 Corns and callosities: Secondary | ICD-10-CM | POA: Diagnosis not present

## 2021-10-07 DIAGNOSIS — B351 Tinea unguium: Secondary | ICD-10-CM

## 2021-10-07 NOTE — Progress Notes (Signed)
Subjective: Alejandro Taylor is a 61 y.o. male patient with history of diabetes who presents to office today complaining of pain to both feet states that his toes hurt and it feels like they are always numb and sometimes really painful.  Patient also complains of long,mildly painful nails  while ambulating in shoes; unable to trim states that sometimes his big toes digging in and he gets hard skin on the side especially the left big toe. Relates burning and tingling in their feet. Patient is diabetic and last A1c was  Lab Results  Component Value Date   HGBA1C 8.3 (H) 11/25/2016   .   PCP:  Wynelle Fanny, DO      Fasting blood sugar not recorded Patient Active Problem List   Diagnosis Date Noted   Diabetes mellitus, type 2 ([place]) 04/29/2019   Drug tolerance 12/23/2018   Chronic back pain 05/11/2018   Hyperlipidemia 05/11/2018   GAD (generalized anxiety disorder) 01/06/2018   Tear of medial meniscus of knee 06/16/2017   Angina pectoris ([place]) 03/24/2017   H/O heart artery stent 09/29/2016   Old MI (myocardial infarction) 09/29/2016   CAD in native artery 04/07/2016   Dyslipidemia (high LDL; low HDL) 04/07/2016   Essential hypertension 04/07/2016   Hx of CABG 04/07/2016   Ischemic cardiomyopathy 04/07/2016   Current Outpatient Medications on File Prior to Visit  Medication Sig Dispense Refill   ALPRAZolam (XANAX) 0.5 MG tablet Take 0.125-0.25 mg by mouth 2 (two) times daily as needed for anxiety.     aspirin 81 MG EC tablet Take by mouth.     atorvastatin (LIPITOR) 80 MG tablet Take 80 mg by mouth daily.     B-D 3CC LUER-LOK SYR 22GX1" 22G X 1" 3 ML MISC Inject into the muscle every 14 (fourteen) days.     carvedilol (COREG) 12.5 MG tablet      chlorhexidine (PERIDEX) 0.12 % solution 15 mLs 2 (two) times daily.     cloNIDine (CATAPRES) 0.1 MG tablet Take 0.1 mg by mouth 2 (two) times daily.     colesevelam (WELCHOL) 625 MG tablet Take 1,875 mg by mouth 2 (two) times daily.      fluticasone (FLONASE) 50 MCG/ACT nasal spray Place 1 spray into both nostrils daily.     furosemide (LASIX) 40 MG tablet Take 20 mg by mouth daily.     insulin glargine (LANTUS) 100 UNIT/ML injection Inject 35 Units into the skin every evening.     insulin lispro (HUMALOG) 100 UNIT/ML injection Inject 2-3 Units into the skin every evening.     ipratropium-albuterol (DUONEB) 0.5-2.5 (3) MG/3ML SOLN Inhale 3 mLs into the lungs 4 (four) times daily as needed for shortness of breath.     isosorbide mononitrate (IMDUR) 30 MG 24 hr tablet      JARDIANCE 25 MG TABS tablet Take 25 mg by mouth daily.     lisinopril (ZESTRIL) 20 MG tablet Take 20 mg by mouth daily.     lisinopril (ZESTRIL) 20 MG tablet Take 1 tablet by mouth daily.     metoCLOPramide (REGLAN) 10 MG tablet      metroNIDAZOLE (METROGEL) 1 % gel Apply topically daily.     mupirocin ointment (BACTROBAN) 2 % Apply topically 3 (three) times daily.     NARCAN 4 MG/0.1ML LIQD nasal spray kit SMARTSIG:Both Nares     nitroGLYCERIN (NITROSTAT) 0.4 MG SL tablet Place under the tongue.     nystatin (MYCOSTATIN) 100000 UNIT/ML suspension SMARTSIG:4 Milliliter(s) By Mouth  4 Times Daily     nystatin cream (MYCOSTATIN) Apply 1 application topically daily as needed (skin flares).      Omega-3 1000 MG CAPS Take by mouth.     omeprazole (PRILOSEC) 40 MG capsule Take 1 capsule by mouth daily.     ondansetron (ZOFRAN-ODT) 4 MG disintegrating tablet Take by mouth.     oxycodone (ROXICODONE) 30 MG immediate release tablet Take 60 mg by mouth every 4 (four) hours.     OXYCONTIN 80 MG 12 hr tablet Take 40 mg by mouth 2 (two) times daily as needed (pain).      PARoxetine (PAXIL) 20 MG tablet      potassium chloride SA (KLOR-CON) 20 MEQ tablet Take 20 mEq by mouth 2 (two) times daily.     pravastatin (PRAVACHOL) 40 MG tablet Take 40 mg by mouth daily.     PROAIR HFA 108 (90 Base) MCG/ACT inhaler Inhale 2 puffs into the lungs every 6 (six) hours as needed for  shortness of breath.     promethazine (PHENERGAN) 25 MG tablet Take by mouth.     psyllium (METAMUCIL) 0.52 g capsule Take 0.52 g by mouth 2 (two) times daily.     tamsulosin (FLOMAX) 0.4 MG CAPS capsule Take 0.4 mg by mouth daily.     Testosterone 20.25 MG/ACT (1.62%) GEL SMARTSIG:2 Pump Topical Daily     testosterone cypionate (DEPOTESTOSTERONE CYPIONATE) 200 MG/ML injection Inject 1 dose every 30 days     Ticagrelor (BRILINTA PO) Take 45 mg by mouth 2 (two) times daily.     Umeclidinium-Vilanterol (ANORO ELLIPTA IN) Inhale into the lungs.     UNIFINE PENTIPS 31G X 8 MM MISC USE 1 NEEDLE 4 TIMES DAILY     XTAMPZA ER 36 MG C12A      No current facility-administered medications on file prior to visit.   Allergies  Allergen Reactions   Benicar [Olmesartan]     Caused issues with gallbladder    Ezetimibe Other (See Comments)    Muscle weakness.   Latex Itching and Swelling   Moxifloxacin     Unknown    Prednisone Swelling    Not allergic to prednisolone    Rosuvastatin     Leg cramps     No results found for this or any previous visit (from the past 2160 hour(s)).  Objective: General: Patient is awake, alert, and oriented x 3 and in no acute distress.  Integument: Skin is warm, dry and supple bilateral. Nails are tender, long, thickened and  dystrophic with subungual debris, consistent with onychomycosis, 1-5 bilateral.  There is minimal reactive keratosis noted to the left hallux distal tuft and medial first MPJs bilateral.  No signs of infection. Remaining integument unremarkable.  Vasculature:  Dorsalis Pedis pulse 1/4 bilateral. Posterior Tibial pulse 1/4 bilateral.  Capillary fill time <3 sec 1-5 bilateral. Positive hair growth to the level of the digits. Temperature gradient within normal limits. No varicosities present bilateral. No edema present bilateral.   Neurology: The patient has intact sensation measured with a 5.07/10g Semmes Weinstein Monofilament at all pedal  sites bilateral . Vibratory sensation diminished bilateral with tuning fork. No Babinski sign present bilateral.   Musculoskeletal: Asymptomatic hammertoes and prominent metatarsal heads pedal deformities noted bilateral. Muscular strength 5/5 in all lower extremity muscular groups bilateral without pain on range of motion . No tenderness with calf compression bilateral.  X-rays no acute fracture or dislocation.  There is hammertoe deformity noted bilateral.  Assessment and  Plan: Problem List Items Addressed This Visit   None Visit Diagnoses     Pain due to onychomycosis of nail    -  Primary   Diabetic polyneuropathy associated with type 2 diabetes mellitus ([place])           -Examined patient. -Discussed and educated patient on diabetic foot care, especially with  regards to the vascular, neurological and musculoskeletal systems.  -Stressed the importance of good glycemic control and the detriment of not  controlling glucose levels in relation to the foot. -Mechanically debrided all painful nails 1-5 bilateral using sterile nail nipper and filed with dremel without incident  -Mechanically pared reactive keratosis at left great toe and medial first MPJs using a sterile 15 blade without incident -Educated patient on peripheral neuropathy and encourage glycemic control as above -Answered all patient questions -Patient to return  in 3 months for at risk foot care and as scheduled for diabetic shoe measurements -Patient advised to call the office if any problems or questions arise in the meantime.  Lorenda Peck, DPM

## 2021-11-13 ENCOUNTER — Encounter: Payer: Self-pay | Admitting: Podiatry

## 2022-01-20 ENCOUNTER — Encounter: Payer: Self-pay | Admitting: Physician Assistant

## 2022-01-23 ENCOUNTER — Ambulatory Visit (INDEPENDENT_AMBULATORY_CARE_PROVIDER_SITE_OTHER): Payer: Medicare Other | Admitting: Podiatry

## 2022-01-23 DIAGNOSIS — E1142 Type 2 diabetes mellitus with diabetic polyneuropathy: Secondary | ICD-10-CM | POA: Diagnosis not present

## 2022-01-23 DIAGNOSIS — B351 Tinea unguium: Secondary | ICD-10-CM

## 2022-01-23 DIAGNOSIS — L84 Corns and callosities: Secondary | ICD-10-CM

## 2022-01-23 DIAGNOSIS — M79609 Pain in unspecified limb: Secondary | ICD-10-CM

## 2022-01-23 NOTE — Progress Notes (Signed)
ANNUAL DIABETIC FOOT EXAM  Subjective: Alejandro Taylor presents today for annual diabetic foot examination.  Chief Complaint  Patient presents with   Nail Problem    DFC  BG - 160 A1C - 8.0 PCP - Dr Wynelle Fanny , last OV January 10 2022   Callouses    Bilateral callouses    Patient confirms h/o diabetes.  Patient relates 14 year h/o diabetes.  Patient denies any h/o foot wounds.  Patient has been diagnosed with neuropathy.  Risk factors: diabetes, h/o MI, HTN, CHF, hyperlipemia, dyslipidemia, h/o tobacco use in remission.  Wynelle Fanny, DO is patient's PCP.   Past Medical History:  Diagnosis Date   Anginal pain ([place])    Anxiety    Arthritis    CHF (congestive heart failure) ([place])    Depression    Diabetes mellitus without complication (HCC)    Type II   GERD (gastroesophageal reflux disease)    Hypertension    Myocardial infarction ([place])    x2   On home oxygen therapy    only as needed for sleep apnea   Pneumonia    Rotator cuff injury, left, initial encounter    Sleep apnea    Patient Active Problem List   Diagnosis Date Noted   Diabetes mellitus, type 2 ([place]) 04/29/2019   Drug tolerance 12/23/2018   Chronic back pain 05/11/2018   Hyperlipidemia 05/11/2018   GAD (generalized anxiety disorder) 01/06/2018   Tear of medial meniscus of knee 06/16/2017   Angina pectoris ([place]) 03/24/2017   H/O heart artery stent 09/29/2016   Old MI (myocardial infarction) 09/29/2016   CAD in native artery 04/07/2016   Dyslipidemia (high LDL; low HDL) 04/07/2016   Essential hypertension 04/07/2016   Hx of CABG 04/07/2016   Ischemic cardiomyopathy 04/07/2016   Past Surgical History:  Procedure Laterality Date   CORONARY ANGIOPLASTY     Stent x2   CORONARY ARTERY BYPASS GRAFT     ROTATOR CUFF REPAIR     Right   SHOULDER ARTHROSCOPY WITH ROTATOR CUFF REPAIR AND SUBACROMIAL DECOMPRESSION Left 11/27/2016   Procedure: SHOULDER ARTHROSCOPY WITH ROTATOR CUFF REPAIR AND  SUBACROMIAL DECOMPRESSION, distal clavicular resection;  Surgeon: Justice Britain, MD;  Location: [facility];  Service: Orthopedics;  Laterality: Left;   Current Outpatient Medications on File Prior to Visit  Medication Sig Dispense Refill   BRILINTA 90 MG TABS tablet Take 90 mg by mouth 2 (two) times daily.     fluconazole (DIFLUCAN) 200 MG tablet Take 200 mg by mouth daily.     methylPREDNISolone (MEDROL) 4 MG tablet Take by mouth.     XTAMPZA ER 9 MG C12A Take 1 capsule by mouth every 8 (eight) hours.     ALPRAZolam (XANAX) 0.5 MG tablet Take 0.125-0.25 mg by mouth 2 (two) times daily as needed for anxiety.     aspirin 81 MG EC tablet Take by mouth.     atorvastatin (LIPITOR) 80 MG tablet Take 80 mg by mouth daily.     B-D 3CC LUER-LOK SYR 22GX1" 22G X 1" 3 ML MISC Inject into the muscle every 14 (fourteen) days.     carvedilol (COREG) 12.5 MG tablet      chlorhexidine (PERIDEX) 0.12 % solution 15 mLs 2 (two) times daily.     clindamycin (CLINDAGEL) 1 % gel SMARTSIG:sparingly Topical Twice Daily     colesevelam (WELCHOL) 625 MG tablet Take 1,875 mg by mouth 2 (two) times daily.     escitalopram (LEXAPRO) 20 MG tablet  Take 20 mg by mouth daily.     fluticasone (FLONASE) 50 MCG/ACT nasal spray Place 1 spray into both nostrils daily.     furosemide (LASIX) 40 MG tablet Take 20 mg by mouth daily.     insulin glargine (LANTUS) 100 UNIT/ML injection Inject 35 Units into the skin every evening.     insulin lispro (HUMALOG) 100 UNIT/ML injection Inject 2-3 Units into the skin every evening.     ipratropium-albuterol (DUONEB) 0.5-2.5 (3) MG/3ML SOLN Inhale 3 mLs into the lungs 4 (four) times daily as needed for shortness of breath.     isosorbide mononitrate (IMDUR) 30 MG 24 hr tablet      JARDIANCE 25 MG TABS tablet Take 25 mg by mouth daily.     lisinopril (ZESTRIL) 20 MG tablet Take 20 mg by mouth daily.     metoCLOPramide (REGLAN) 10 MG tablet      metroNIDAZOLE (METROGEL) 1 % gel Apply topically  daily.     NARCAN 4 MG/0.1ML LIQD nasal spray kit SMARTSIG:Both Nares     nitroGLYCERIN (NITROSTAT) 0.4 MG SL tablet Place under the tongue.     nystatin (MYCOSTATIN) 100000 UNIT/ML suspension SMARTSIG:4 Milliliter(s) By Mouth 4 Times Daily     nystatin cream (MYCOSTATIN) Apply 1 application topically daily as needed (skin flares).      Omega-3 1000 MG CAPS Take by mouth.     omeprazole (PRILOSEC) 40 MG capsule Take 1 capsule by mouth daily.     oxycodone (ROXICODONE) 30 MG immediate release tablet Take 60 mg by mouth every 4 (four) hours.     PARoxetine (PAXIL) 20 MG tablet      potassium chloride SA (KLOR-CON) 20 MEQ tablet Take 20 mEq by mouth 2 (two) times daily.     pravastatin (PRAVACHOL) 40 MG tablet Take 40 mg by mouth daily.     PROAIR HFA 108 (90 Base) MCG/ACT inhaler Inhale 2 puffs into the lungs every 6 (six) hours as needed for shortness of breath.     promethazine (PHENERGAN) 25 MG tablet Take by mouth.     psyllium (METAMUCIL) 0.52 g capsule Take 0.52 g by mouth 2 (two) times daily.     tamsulosin (FLOMAX) 0.4 MG CAPS capsule Take 0.4 mg by mouth daily.     Testosterone 20.25 MG/ACT (1.62%) GEL SMARTSIG:2 Pump Topical Daily     testosterone cypionate (DEPOTESTOSTERONE CYPIONATE) 200 MG/ML injection Inject 1 dose every 30 days     triamcinolone cream (KENALOG) 0.1 %      Umeclidinium-Vilanterol (ANORO ELLIPTA IN) Inhale into the lungs.     UNIFINE PENTIPS 31G X 8 MM MISC USE 1 NEEDLE 4 TIMES DAILY     No current facility-administered medications on file prior to visit.    Allergies  Allergen Reactions   Benicar [Olmesartan]     Caused issues with gallbladder    Ezetimibe Other (See Comments)    Muscle weakness.   Latex Itching and Swelling   Moxifloxacin     Unknown    Prednisone Swelling    Not allergic to prednisolone    Rosuvastatin     Leg cramps    Social History   Occupational History   Not on file  Tobacco Use   Smoking status: Former    Types:  Cigarettes   Smokeless tobacco: Current    Types: Chew  Vaping Use   Vaping Use: Never used  Substance and Sexual Activity   Alcohol use: No   Drug use:  No   Sexual activity: Not on file   No family history on file. Immunization History  Administered Date(s) Administered   Influenza-Unspecified 01/15/2018     Review of Systems: Negative except as noted in the HPI.   Objective: There were no vitals filed for this visit.  Abdias Hickam is a pleasant 61 y.o. male in NAD. AAO X 3.  Vascular Examination: CFT <3 seconds b/l. DP/PT pulses faintly palpable b/l. Skin temperature gradient warm to warm b/l. No pain with calf compression. No ischemia or gangrene. No cyanosis or clubbing noted b/l. No edema noted b/l LE.   Neurological Examination: Sensation grossly intact b/l with 10 gram monofilament. Vibratory sensation diminished b/l.  Dermatological Examination: Pedal skin warm and supple b/l. Toenails 1-5 b/l thick, discolored, elongated with subungual debris and pain on dorsal palpation.  No open wounds b/l LE. No interdigital macerations noted b/l LE. Hyperkeratotic lesion(s) bilateral great toes, submet head 5 left foot, and 1st metatarsal head b/l lower extremities.  No erythema, no edema, no drainage, no fluctuance.  Musculoskeletal Examination: Muscle strength 5/5 to b/l LE. Hammertoe deformity noted 2-5 b/l.  Radiographs: None  Footwear Assessment: Does the patient wear appropriate shoes? Yes. Does the patient need inserts/orthotics? Yes.  ADA Risk Categorization: Low Risk :  Patient has all of the following: Intact protective sensation No prior foot ulcer  No severe deformity Pedal pulses present  Assessment: 1. Pain due to onychomycosis of nail   2. Callus of foot   3. Diabetic polyneuropathy associated with type 2 diabetes mellitus ([place])     Plan: -Patient was evaluated and treated. All patient's and/or POA's questions/concerns answered on today's  visit. -Medicaid ABN on file for paring of corn(s)/callus(es)/porokeratos(es). Copy in patient chart. -Diabetic foot examination performed today. -Continue foot and shoe inspections daily. Monitor blood glucose per PCP/Endocrinologist's recommendations. -Toenails 1-5 b/l were debrided in length and girth with sterile nail nippers and dremel without iatrogenic bleeding.  -Callus(es) distal tip of bilateral great toes, submet head 5 left foot, and 1st metatarsal head b/l lower extremities pared utilizing sterile scalpel blade without complication or incident. Total number debrided =5. -Patient/POA to call should there be question/concern in the interim. Return in about 3 months (around 04/25/2022).  Marzetta Board, DPM

## 2022-01-23 NOTE — Patient Instructions (Signed)
It was my pleasure serving you today! You had your annual diabetic foot examination performed today. You will notice a charge for an office visit on today's billing and this is for your annual diabetic foot exam. It is done once yearly.  Diabetes Mellitus and Foot Care Diabetes, also called diabetes mellitus, may cause problems with your feet and legs because of poor blood flow (circulation). Poor circulation may make your skin: Become thinner and drier. Break more easily. Heal more slowly. Peel and crack. You may also have nerve damage (neuropathy). This can cause decreased feeling in your legs and feet. This means that you may not notice minor injuries to your feet that could lead to more serious problems. Finding and treating problems early is the best way to prevent future foot problems. How to care for your feet Foot hygiene  Wash your feet daily with warm water and mild soap. Do not use hot water. Then, pat your feet and the areas between your toes until they are fully dry. Do not soak your feet. This can dry your skin. Trim your toenails straight across. Do not dig under them or around the cuticle. File the edges of your nails with an emery board or nail file. Apply a moisturizing lotion or petroleum jelly to the skin on your feet and to dry, brittle toenails. Use lotion that does not contain alcohol and is unscented. Do not apply lotion between your toes. Shoes and socks Wear clean socks or stockings every day. Make sure they are not too tight. Do not wear knee-high stockings. These may decrease blood flow to your legs. Wear shoes that fit well and have enough cushioning. Always look in your shoes before you put them on to be sure there are no objects inside. To break in new shoes, wear them for just a few hours a day. This prevents injuries on your feet. Wounds, scrapes, corns, and calluses  Check your feet daily for blisters, cuts, bruises, sores, and redness. If you cannot see the  bottom of your feet, use a mirror or ask someone for help. Do not cut off corns or calluses or try to remove them with medicine. If you find a minor scrape, cut, or break in the skin on your feet, keep it and the skin around it clean and dry. You may clean these areas with mild soap and water. Do not clean the area with peroxide, alcohol, or iodine. If you have a wound, scrape, corn, or callus on your foot, look at it several times a day to make sure it is healing and not infected. Check for: Redness, swelling, or pain. Fluid or blood. Warmth. Pus or a bad smell. General tips Do not cross your legs. This may decrease blood flow to your feet. Do not use heating pads or hot water bottles on your feet. They may burn your skin. If you have lost feeling in your feet or legs, you may not know this is happening until it is too late. Protect your feet from hot and cold by wearing shoes, such as at the beach or on hot pavement. Schedule a complete foot exam at least once a year or more often if you have foot problems. Report any cuts, sores, or bruises to your health care provider right away. Where to find more information American Diabetes Association: diabetes.org Association of Diabetes Care & Education Specialists: diabeteseducator.org Contact a health care provider if: You have a condition that increases your risk of infection, and you  have any cuts, sores, or bruises on your feet. You have an injury that is not healing. You have redness on your legs or feet. You feel burning or tingling in your legs or feet. You have pain or cramps in your legs and feet. Your legs or feet are numb. Your feet always feel cold. You have pain around any toenails. Get help right away if: You have a wound, scrape, corn, or callus on your foot and: You have signs of infection. You have a fever. You have a red line going up your leg. This information is not intended to replace advice given to you by your health  care provider. Make sure you discuss any questions you have with your health care provider. Document Revised: 09/04/2021 Document Reviewed: 09/04/2021 Elsevier Patient Education  [place].

## 2022-01-27 ENCOUNTER — Encounter: Payer: Self-pay | Admitting: Podiatry

## 2022-02-25 ENCOUNTER — Encounter: Payer: Self-pay | Admitting: Physician Assistant

## 2022-02-25 ENCOUNTER — Ambulatory Visit (INDEPENDENT_AMBULATORY_CARE_PROVIDER_SITE_OTHER): Payer: Medicare Other | Admitting: Physician Assistant

## 2022-02-25 VITALS — BP 140/90 | HR 93 | Ht 68.0 in | Wt 218.5 lb

## 2022-02-25 DIAGNOSIS — Z7901 Long term (current) use of anticoagulants: Secondary | ICD-10-CM | POA: Diagnosis not present

## 2022-02-25 DIAGNOSIS — R195 Other fecal abnormalities: Secondary | ICD-10-CM

## 2022-02-25 DIAGNOSIS — Z9981 Dependence on supplemental oxygen: Secondary | ICD-10-CM | POA: Diagnosis not present

## 2022-02-25 NOTE — Patient Instructions (Addendum)
Will call to schedule once Dr. Lyndel Safe has an opening in March.  _______________________________________________________  If you are age 60 or older, your body mass index should be between 23-30. Your Body mass index is 33.22 kg/m. If this is out of the aforementioned range listed, please consider follow up with your Primary Care Provider.  If you are age 5 or younger, your body mass index should be between 19-25. Your Body mass index is 33.22 kg/m. If this is out of the aformentioned range listed, please consider follow up with your Primary Care Provider.   ________________________________________________________  The [facility] providers would like to encourage you to use [facility] to communicate with providers for non-urgent requests or questions.  Due to long hold times on the telephone, sending your provider a message by [facility] may be a faster and more efficient way to get a response.  Please allow 48 business hours for a response.  Please remember that this is for non-urgent requests.  _______________________________________________________

## 2022-02-25 NOTE — Progress Notes (Signed)
 Chief Complaint: Discuss colonoscopy on chronic anticoagulation, positive Cologuard  HPI:    Alejandro Taylor is a 61-year-old male with a past medical history as listed below including anxiety, CHF, diabetes, GERD, previous MI on oxygen, who was referred to me by Lim, Sheri, DO for a complaint of positive Cologuard.    12/09/2021 patient followed with his cardiologist in regards to his ischemic dilated cardiomyopathy, CAD with CABG in 2007, NSTEMI 2017, stent placement 2019 and 2020 as well as the last 10/22.  Continued on Brilinta and he was doing well and told to follow-up in 6 months.    01/01/2022 Cologuard positive.     Today, the patient presents to clinic and tells me that he had a previous colonoscopy about 10 years ago or so with Dr. Gupta which was negative/normal.  Last year he had what sounds like a Hemoccult test which was negative and this year he had a Cologuard test which turned out to be positive.  Patient is very anxious about these results and what they mean.  Tells me he does not have any GI issues.      He does discuss his claustrophobia and need to leave the exam room door open and also the fact that he typically uses oxygen at night.  He is also currently on Brilinta.    He is from [place].    Denies fever, chills, abdominal pain, weight loss or blood in his stool.  Past Medical History:  Diagnosis Date   Anginal pain (HCC)    Anxiety    Arthritis    CHF (congestive heart failure) (HCC)    Depression    Diabetes mellitus without complication (HCC)    Type II   GERD (gastroesophageal reflux disease)    Hypertension    Myocardial infarction (HCC)    x2   On home oxygen therapy    only as needed for sleep apnea   Pneumonia    Rotator cuff injury, left, initial encounter    Sleep apnea     Past Surgical History:  Procedure Laterality Date   CORONARY ANGIOPLASTY     Stent x2   CORONARY ARTERY BYPASS GRAFT     ROTATOR CUFF REPAIR     Right   SHOULDER  ARTHROSCOPY WITH ROTATOR CUFF REPAIR AND SUBACROMIAL DECOMPRESSION Left 11/27/2016   Procedure: SHOULDER ARTHROSCOPY WITH ROTATOR CUFF REPAIR AND SUBACROMIAL DECOMPRESSION, distal clavicular resection;  Surgeon: Supple, Kevin, MD;  Location: [facility] OR;  Service: Orthopedics;  Laterality: Left;    Current Outpatient Medications  Medication Sig Dispense Refill   ALPRAZolam (XANAX) 0.5 MG tablet Take 0.125-0.25 mg by mouth 2 (two) times daily as needed for anxiety.     aspirin 81 MG EC tablet Take by mouth.     atorvastatin (LIPITOR) 80 MG tablet Take 80 mg by mouth daily.     B-D 3CC LUER-LOK SYR 22GX1" 22G X 1" 3 ML MISC Inject into the muscle every 14 (fourteen) days.     BRILINTA 90 MG TABS tablet Take 90 mg by mouth 2 (two) times daily.     carvedilol (COREG) 12.5 MG tablet      chlorhexidine (PERIDEX) 0.12 % solution 15 mLs 2 (two) times daily.     clindamycin (CLINDAGEL) 1 % gel SMARTSIG:sparingly Topical Twice Daily     colesevelam (WELCHOL) 625 MG tablet Take 1,875 mg by mouth 2 (two) times daily.     escitalopram (LEXAPRO) 20 MG tablet Take 20 mg by mouth daily.       fluconazole (DIFLUCAN) 200 MG tablet Take 200 mg by mouth daily.     fluticasone (FLONASE) 50 MCG/ACT nasal spray Place 1 spray into both nostrils daily.     furosemide (LASIX) 40 MG tablet Take 20 mg by mouth daily.     insulin glargine (LANTUS) 100 UNIT/ML injection Inject 35 Units into the skin every evening.     insulin lispro (HUMALOG) 100 UNIT/ML injection Inject 2-3 Units into the skin every evening.     ipratropium-albuterol (DUONEB) 0.5-2.5 (3) MG/3ML SOLN Inhale 3 mLs into the lungs 4 (four) times daily as needed for shortness of breath.     isosorbide mononitrate (IMDUR) 30 MG 24 hr tablet      JARDIANCE 25 MG TABS tablet Take 25 mg by mouth daily.     lisinopril (ZESTRIL) 20 MG tablet Take 20 mg by mouth daily.     methylPREDNISolone (MEDROL) 4 MG tablet Take by mouth.     metoCLOPramide (REGLAN) 10 MG tablet       metroNIDAZOLE (METROGEL) 1 % gel Apply topically daily.     NARCAN 4 MG/0.1ML LIQD nasal spray kit SMARTSIG:Both Nares     nitroGLYCERIN (NITROSTAT) 0.4 MG SL tablet Place under the tongue.     nystatin (MYCOSTATIN) 100000 UNIT/ML suspension SMARTSIG:4 Milliliter(s) By Mouth 4 Times Daily     nystatin cream (MYCOSTATIN) Apply 1 application topically daily as needed (skin flares).      Omega-3 1000 MG CAPS Take by mouth.     omeprazole (PRILOSEC) 40 MG capsule Take 1 capsule by mouth daily.     oxycodone (ROXICODONE) 30 MG immediate release tablet Take 60 mg by mouth every 4 (four) hours.     PARoxetine (PAXIL) 20 MG tablet      potassium chloride SA (KLOR-CON) 20 MEQ tablet Take 20 mEq by mouth 2 (two) times daily.     pravastatin (PRAVACHOL) 40 MG tablet Take 40 mg by mouth daily.     PROAIR HFA 108 (90 Base) MCG/ACT inhaler Inhale 2 puffs into the lungs every 6 (six) hours as needed for shortness of breath.     promethazine (PHENERGAN) 25 MG tablet Take by mouth.     psyllium (METAMUCIL) 0.52 g capsule Take 0.52 g by mouth 2 (two) times daily.     tamsulosin (FLOMAX) 0.4 MG CAPS capsule Take 0.4 mg by mouth daily.     Testosterone 20.25 MG/ACT (1.62%) GEL SMARTSIG:2 Pump Topical Daily     testosterone cypionate (DEPOTESTOSTERONE CYPIONATE) 200 MG/ML injection Inject 1 dose every 30 days     triamcinolone cream (KENALOG) 0.1 %      Umeclidinium-Vilanterol (ANORO ELLIPTA IN) Inhale into the lungs.     UNIFINE PENTIPS 31G X 8 MM MISC USE 1 NEEDLE 4 TIMES DAILY     XTAMPZA ER 9 MG C12A Take 1 capsule by mouth every 8 (eight) hours.     No current facility-administered medications for this visit.    Allergies as of 02/25/2022 - Review Complete 01/27/2022  Allergen Reaction Noted   Benicar [olmesartan]  11/24/2016   Ezetimibe Other (See Comments) 03/24/2017   Latex Itching and Swelling 10/16/2014   Moxifloxacin  10/16/2014   Prednisone Swelling 10/16/2014   Rosuvastatin  10/16/2014     No family history on file.  Social History   Socioeconomic History   Marital status: Married    Spouse name: Not on file   Number of children: Not on file   Years of education: Not on file     Highest education level: Not on file  Occupational History   Not on file  Tobacco Use   Smoking status: Former    Types: Cigarettes   Smokeless tobacco: Current    Types: Chew  Vaping Use   Vaping Use: Never used  Substance and Sexual Activity   Alcohol use: No   Drug use: No   Sexual activity: Not on file  Other Topics Concern   Not on file  Social History Narrative   Not on file   Social Determinants of Health   Financial Resource Strain: Not on file  Food Insecurity: Not on file  Transportation Needs: Not on file  Physical Activity: Not on file  Stress: Not on file  Social Connections: Not on file  Intimate Partner Violence: Not on file    Review of Systems:    Constitutional: No weight loss, fever or chills Skin: No rash  Cardiovascular: No chest pain Respiratory: No SOB  Gastrointestinal: See HPI and otherwise negative Genitourinary: No dysuria  Neurological: No headache, dizziness or syncope Musculoskeletal: No new muscle or joint pain Hematologic: No bleeding  Psychiatric: +anxiety   Physical Exam:  Vital signs: BP (!) 140/90   Pulse 93   Ht 5' 8" (1.727 m)   Wt 218 lb 8 oz (99.1 kg)   BMI 33.22 kg/m    Constitutional:   Pleasant male appears to be in NAD, Well developed, Well nourished, alert and cooperative Head:  Normocephalic and atraumatic. Eyes:   PEERL, EOMI. No icterus. Conjunctiva pink. Ears:  Normal auditory acuity. Neck:  Supple Throat: Oral cavity and pharynx without inflammation, swelling or lesion.  Respiratory: Respirations even and unlabored. Lungs clear to auscultation bilaterally.   No wheezes, crackles, or rhonchi.  Cardiovascular: Normal S1, S2. No MRG. Regular rate and rhythm. No peripheral edema, cyanosis or pallor.   Gastrointestinal:  Soft, nondistended, nontender. No rebound or guarding. Normal bowel sounds. No appreciable masses or hepatomegaly. Rectal:  Not performed.  Msk:  Symmetrical without gross deformities. Without edema, no deformity or joint abnormality.  Neurologic:  Alert and  oriented x4;  grossly normal neurologically.  Skin:   Dry and intact without significant lesions or rashes. Psychiatric:  Demonstrates good judgement and reason without abnormal affect or behaviors.  No recent labs.  Assessment: 1.  Positive Cologuard 2.  History of prior MI and CAD status post stenting last at the end of 2022: On Brilinta 3.  Oxygen use at night  Plan: 1.  Discussed with patient that his procedure will need to be scheduled at the hospital with Dr. Gupta given his oxygen use at night.  Did provide the patient with a detailed list of risks for the procedure and he agrees to proceed.  Dr. Gupta does not have any availability at this time and patient was advised that we would call in March when his schedule is open. 2.  Also advised the patient to hold his Brilinta for 5 days prior to time of procedure.  We will communicate with his prescribing physician to ensure this is acceptable for him. 3.  Reviewed his positive Cologuard results and exactly what this means.  Tried to calm his nerves a bit. 4.  Patient to follow in clinic per recommendations from Dr. Gupta after time of procedure.  Jennifer Lemmon, PA-C [facility] 02/25/2022, 10:05 AM  Cc: Lim, Sheri, DO  

## 2022-03-02 NOTE — Progress Notes (Signed)
Agree with assessment/plan.  Raj Timothey Dahlstrom, MD [facility] [phone]  

## 2022-04-16 ENCOUNTER — Telehealth: Payer: Self-pay | Admitting: Podiatry

## 2022-04-16 NOTE — Telephone Encounter (Signed)
Patient left message on voicemail about getting his diabetic shoes from 07/2021 , I called patient to explain that we would have to start the whole process over , we never received the paperwork back to process the order. He said he picked the paperwork up and took it to his doctor and they faxed it back to [place].  I can't find any paperwork in the system?  He is very upset and wants to talk to management does not think its fair that he missed out on last years shoes and now he has to come to [facility] to get casted and start the process over again for this year.  Our mistakes cost him his diabetic shoes, I tried to explain to him that we are restructuring the department, he did not want excuses.    I told patient I would have Keely call him.

## 2022-04-17 ENCOUNTER — Telehealth: Payer: Self-pay

## 2022-04-17 NOTE — Telephone Encounter (Signed)
But he now scheduled for a previsit nurse telephone on 2-22 in the afternoon and at the 4-11 at the hospital and that I will be working on his hold for his brilinta 5 days prior and we have to get that approved by Dr Otho Perl and he said ok. He has the address for the hospital as well

## 2022-04-17 NOTE — Addendum Note (Signed)
Addended by: Villa Herb on: 04/17/2022 05:13 PM   Modules accepted: Orders

## 2022-04-17 NOTE — Telephone Encounter (Signed)
Call patient again and this time he picked up the phone and identified myself and told him I was calling to see about scheduling his appointment in April at the hospital. Told him the date would be April the 11th and that I needed to see if he willing to do this date and I needed to call the hospital to schedule and he proceeded to tell me that he has been waiting for 4 months now so why so long and I told him that we have had a back long and we are scheduling patients from our waiting list and that as of now I have April available and I know you were last seen in December by our PA and he said that the other patients didn't matter and its about him and his life and I told him that its about all of our lives and that we are working as best as we can and he said that I'm talking like I'm shit and I told him I am trying to get him scheduled for a colonoscopy and if he will would like to take the April date he can but I needed to call the hospital and he said yes

## 2022-04-17 NOTE — Telephone Encounter (Signed)
LVM for patient to call back to see about a Colonoscopy at [facility] in April as of now

## 2022-04-28 NOTE — Telephone Encounter (Signed)
Clearance received for patient to hold brilinta 5 days prior to the procedure from Dr Otho Perl and said said to resume as soon as felt safe post procedure. Sent for scanning. Called patient and LVM for him to know. They will tell him in his previsit as well

## 2022-04-29 NOTE — Telephone Encounter (Signed)
Patient is returning call, was informed to hold Brilinta for 5 days before procedure and resume taking when he felt comfortable after procedure.

## 2022-04-30 NOTE — Telephone Encounter (Signed)
Notes and previsit will tell him to hold it 5 days prior and the dr will tell him to resume it

## 2022-05-08 ENCOUNTER — Ambulatory Visit (AMBULATORY_SURGERY_CENTER): Payer: 59 | Admitting: *Deleted

## 2022-05-08 VITALS — Ht 68.0 in | Wt 217.0 lb

## 2022-05-08 DIAGNOSIS — Z1211 Encounter for screening for malignant neoplasm of colon: Secondary | ICD-10-CM

## 2022-05-08 MED ORDER — NA SULFATE-K SULFATE-MG SULF 17.5-3.13-1.6 GM/177ML PO SOLN: 1.0000 | Freq: Once | ORAL | 0 refills | Status: AC

## 2022-05-08 NOTE — Progress Notes (Signed)
No egg or soy allergy known to patient  No issues known to pt with past sedation with any surgeries or procedures Patient denies ever being told they had issues or difficulty with intubation  No FH of Malignant Hyperthermia Pt is on diet pills MD orders to hold for 5 days Pt is not on  home 02  Pt is not on blood thinners  Pt denies issues with constipation  Pt is not on dialysis Pt denies any upcoming cardiac testing Pt encouraged to use to use Singlecare or Goodrx to reduce cost  Patient's chart reviewed by Osvaldo Angst CNRA prior to previsit and patient appropriate for the [place].  Previsit completed and red dot placed by patient's name on their procedure day (on provider's schedule).  . Visit by phone Instructions reviewed with pt and pt states understanding. Instructed to review again prior to procedure. Pt states they will. Instructions sent by mail  Do not chew day of procedure

## 2022-05-29 ENCOUNTER — Ambulatory Visit (INDEPENDENT_AMBULATORY_CARE_PROVIDER_SITE_OTHER): Payer: 59 | Admitting: Podiatry

## 2022-05-29 ENCOUNTER — Encounter: Payer: Self-pay | Admitting: Podiatry

## 2022-05-29 DIAGNOSIS — E1142 Type 2 diabetes mellitus with diabetic polyneuropathy: Secondary | ICD-10-CM

## 2022-05-29 DIAGNOSIS — B351 Tinea unguium: Secondary | ICD-10-CM

## 2022-05-29 DIAGNOSIS — L84 Corns and callosities: Secondary | ICD-10-CM | POA: Diagnosis not present

## 2022-05-29 DIAGNOSIS — M79676 Pain in unspecified toe(s): Secondary | ICD-10-CM | POA: Diagnosis not present

## 2022-05-29 DIAGNOSIS — M79609 Pain in unspecified limb: Secondary | ICD-10-CM

## 2022-05-29 NOTE — Telephone Encounter (Signed)
Left message on voicemail for patient to call back and set up appointment for 06/24/22 to be measured for diabetic shoes.    We never received the medicare paperwork back from his doctors office , I checked his chart to see if it was scanned in by mistake and it was not there , I checked [employer] and they never received it either.

## 2022-05-29 NOTE — Progress Notes (Signed)
  Subjective:  Patient ID: Alejandro Taylor, male    DOB: Jun 04, 1960,  MRN: [id number]  Freman Lapage presents to clinic today for at risk foot care with history of diabetic neuropathy and callus(es) both feet and painful thick toenails that are difficult to trim. Painful toenails interfere with ambulation. Aggravating factors include wearing enclosed shoe gear. Pain is relieved with periodic professional debridement. Painful calluses are aggravated when weightbearing with and without shoegear. Pain is relieved with periodic professional debridement.   Patient is upset on today's visit as he has not received his diabetic shoes. He states he watched his PCP fax the certification paperwork to our office and he did not receive his shoes last year. Patient states he was told everything was thrown away from last year's fitting.  PCP is Wynelle Fanny, DO.  Allergies  Allergen Reactions   Benicar [Olmesartan]     Caused issues with gallbladder    Ezetimibe Other (See Comments)    Muscle weakness.   Latex Itching and Swelling   Moxifloxacin     Unknown    Prednisone Swelling    Not allergic to prednisolone    Rosuvastatin     Leg cramps    Review of Systems: Negative except as noted in the HPI.  Objective:  There were no vitals filed for this visit. Endy Easterly is a pleasant 62 y.o. male WD, WN in NAD. AAO x 3.   Vascular Examination: CFT <3 seconds b/l. DP/PT pulses faintly palpable b/l. Skin temperature gradient warm to warm b/l. No pain with calf compression. No ischemia or gangrene. No cyanosis or clubbing noted b/l. No edema noted b/l LE.   Neurological Examination: Sensation grossly intact b/l with 10 gram monofilament. Vibratory sensation diminished b/l.  Dermatological Examination: Pedal skin warm and supple b/l. Toenails 1-5 b/l thick, discolored, elongated with subungual debris and pain on dorsal palpation.  No open wounds b/l LE. No interdigital macerations noted  b/l LE.   Hyperkeratotic lesion(s) bilateral great toes. No erythema, no edema, no drainage, no fluctuance.  Minimal hyperkeratosis submet head 5 left foot, and 1st metatarsal head b/l lower extremities.  No erythema, no edema, no drainage, no fluctuance.  Musculoskeletal Examination: Muscle strength 5/5 to b/l LE. Hammertoe deformity noted 2-5 b/l.  Radiographs: None  Assessment/Plan: 1. Pain due to onychomycosis of nail   2. Callus of foot   3. Diabetic polyneuropathy associated with type 2 diabetes mellitus ([place])     FOR HOME USE ONLY DME DIABETIC SHOE -Patient was evaluated and treated. All patient's and/or POA's questions/concerns answered on today's visit. -Patient was given practice administrator's business card on today. -New order written for diabetic shoes/inserts on today's visit. I explained it is possible that 2023 fitting supplies were disposed of as this was from 2023 fitting and he would most likely need to restart the process since this is a new benefit period. He related understanding.  -Patient to continue soft, supportive shoe gear daily. -Toenails 1-5 b/l were debrided in length and girth with sterile nail nippers and dremel without iatrogenic bleeding.  -Hyperkeratotic lesion(s) bilateral great toes debrided with dremel bur without incident. Total number debrided=2. -Patient/POA to call should there be question/concern in the interim.   Return in about 3 months (around 08/29/2022).  Marzetta Board, DPM

## 2022-06-19 ENCOUNTER — Encounter (HOSPITAL_COMMUNITY): Payer: Self-pay | Admitting: Gastroenterology

## 2022-06-24 ENCOUNTER — Ambulatory Visit (INDEPENDENT_AMBULATORY_CARE_PROVIDER_SITE_OTHER): Payer: Medicaid Other | Admitting: Podiatry

## 2022-06-24 DIAGNOSIS — E1142 Type 2 diabetes mellitus with diabetic polyneuropathy: Secondary | ICD-10-CM

## 2022-06-24 DIAGNOSIS — M2042 Other hammer toe(s) (acquired), left foot: Secondary | ICD-10-CM

## 2022-06-24 DIAGNOSIS — L84 Corns and callosities: Secondary | ICD-10-CM

## 2022-06-24 DIAGNOSIS — M2041 Other hammer toe(s) (acquired), right foot: Secondary | ICD-10-CM

## 2022-06-24 NOTE — Progress Notes (Signed)
Patient presents to the office today for diabetic shoe and insole measuring.  Patient was measured with brannock device to determine size and width for 1 pair of extra depth shoes and foam casted for 3 pair of insoles.   ABN signed.   Documentation of medical necessity will be sent to patient's treating diabetic doctor to verify and sign.   Patient's diabetic provider: Verdell Face, DO   Shoes and insoles will be ordered at that time and patient will be notified for an appointment for fitting when they arrive.   Brannock measurement: 12  Patient shoe selection-   1st   Shoe choice:   682  Shoe size ordered: 12

## 2022-06-26 ENCOUNTER — Encounter (HOSPITAL_COMMUNITY): Payer: Self-pay | Admitting: Gastroenterology

## 2022-06-26 ENCOUNTER — Other Ambulatory Visit: Payer: Self-pay

## 2022-06-26 ENCOUNTER — Ambulatory Visit (HOSPITAL_COMMUNITY): Payer: 59 | Admitting: Anesthesiology

## 2022-06-26 ENCOUNTER — Ambulatory Visit (HOSPITAL_COMMUNITY)
Admission: RE | Admit: 2022-06-26 | Discharge: 2022-06-26 | Disposition: A | Discharge status: Home / Self Care | Payer: 59 | Attending: Gastroenterology | Admitting: Gastroenterology

## 2022-06-26 ENCOUNTER — Ambulatory Visit (HOSPITAL_BASED_OUTPATIENT_CLINIC_OR_DEPARTMENT_OTHER): Payer: 59 | Admitting: Anesthesiology

## 2022-06-26 ENCOUNTER — Encounter (HOSPITAL_COMMUNITY)
Admission: RE | Disposition: A | Discharge status: Home / Self Care | Payer: Self-pay | Source: Home / Self Care | Attending: Gastroenterology

## 2022-06-26 DIAGNOSIS — Z7901 Long term (current) use of anticoagulants: Secondary | ICD-10-CM | POA: Diagnosis not present

## 2022-06-26 DIAGNOSIS — K640 First degree hemorrhoids: Secondary | ICD-10-CM

## 2022-06-26 DIAGNOSIS — J449 Chronic obstructive pulmonary disease, unspecified: Secondary | ICD-10-CM | POA: Insufficient documentation

## 2022-06-26 DIAGNOSIS — Z951 Presence of aortocoronary bypass graft: Secondary | ICD-10-CM | POA: Insufficient documentation

## 2022-06-26 DIAGNOSIS — E119 Type 2 diabetes mellitus without complications: Secondary | ICD-10-CM | POA: Diagnosis not present

## 2022-06-26 DIAGNOSIS — K5730 Diverticulosis of large intestine without perforation or abscess without bleeding: Secondary | ICD-10-CM

## 2022-06-26 DIAGNOSIS — I252 Old myocardial infarction: Secondary | ICD-10-CM

## 2022-06-26 DIAGNOSIS — I509 Heart failure, unspecified: Secondary | ICD-10-CM | POA: Insufficient documentation

## 2022-06-26 DIAGNOSIS — Z7984 Long term (current) use of oral hypoglycemic drugs: Secondary | ICD-10-CM | POA: Diagnosis not present

## 2022-06-26 DIAGNOSIS — Z1211 Encounter for screening for malignant neoplasm of colon: Secondary | ICD-10-CM | POA: Diagnosis not present

## 2022-06-26 DIAGNOSIS — K219 Gastro-esophageal reflux disease without esophagitis: Secondary | ICD-10-CM | POA: Insufficient documentation

## 2022-06-26 DIAGNOSIS — Z955 Presence of coronary angioplasty implant and graft: Secondary | ICD-10-CM | POA: Insufficient documentation

## 2022-06-26 DIAGNOSIS — D759 Disease of blood and blood-forming organs, unspecified: Secondary | ICD-10-CM | POA: Insufficient documentation

## 2022-06-26 DIAGNOSIS — I25119 Atherosclerotic heart disease of native coronary artery with unspecified angina pectoris: Secondary | ICD-10-CM | POA: Diagnosis not present

## 2022-06-26 DIAGNOSIS — I110 Hypertensive heart disease with heart failure: Secondary | ICD-10-CM | POA: Diagnosis not present

## 2022-06-26 DIAGNOSIS — G4730 Sleep apnea, unspecified: Secondary | ICD-10-CM | POA: Insufficient documentation

## 2022-06-26 DIAGNOSIS — Z9981 Dependence on supplemental oxygen: Secondary | ICD-10-CM | POA: Insufficient documentation

## 2022-06-26 DIAGNOSIS — Z87891 Personal history of nicotine dependence: Secondary | ICD-10-CM | POA: Insufficient documentation

## 2022-06-26 DIAGNOSIS — R195 Other fecal abnormalities: Secondary | ICD-10-CM

## 2022-06-26 DIAGNOSIS — Z794 Long term (current) use of insulin: Secondary | ICD-10-CM | POA: Diagnosis not present

## 2022-06-26 HISTORY — PX: COLONOSCOPY WITH PROPOFOL: SHX5780

## 2022-06-26 LAB — POCT I-STAT, CHEM 8
BUN: 16 mg/dL (ref 6–20)
Calcium, Ion: 1.16 mmol/L (ref 1.15–1.40)
Chloride: 101 mmol/L (ref 98–111)
Creatinine, Ser: 1.4 mg/dL — ABNORMAL HIGH (ref 0.61–1.24)
Glucose, Bld: 131 mg/dL — ABNORMAL HIGH (ref 70–99)
HCT: 53 % — ABNORMAL HIGH (ref 39.0–52.0)
Hemoglobin: 18 g/dL — ABNORMAL HIGH (ref 13.0–17.0)
Potassium: 4.7 mmol/L (ref 3.5–5.1)
Sodium: 137 mmol/L (ref 135–145)
TCO2: 28 mmol/L (ref 22–32)

## 2022-06-26 SURGERY — COLONOSCOPY WITH PROPOFOL
Anesthesia: Monitor Anesthesia Care

## 2022-06-26 MED ORDER — LACTATED RINGERS IV SOLN: INTRAVENOUS | Status: DC | PRN

## 2022-06-26 MED ORDER — PROPOFOL 10 MG/ML IV BOLUS
INTRAVENOUS | Status: DC | PRN
  Administered 2022-06-26: 30 mg via INTRAVENOUS

## 2022-06-26 MED ORDER — PROPOFOL 500 MG/50ML IV EMUL
INTRAVENOUS | Status: AC
  Filled 2022-06-26: qty 100

## 2022-06-26 MED ORDER — PROPOFOL 500 MG/50ML IV EMUL
INTRAVENOUS | Status: DC | PRN
  Administered 2022-06-26: 125 ug/kg/min via INTRAVENOUS

## 2022-06-26 MED ORDER — PHENYLEPHRINE 80 MCG/ML (10ML) SYRINGE FOR IV PUSH (FOR BLOOD PRESSURE SUPPORT)
PREFILLED_SYRINGE | INTRAVENOUS | Status: DC | PRN
  Administered 2022-06-26: 120 ug via INTRAVENOUS

## 2022-06-26 SURGICAL SUPPLY — 22 items

## 2022-06-26 NOTE — Discharge Instructions (Signed)
YOU HAD AN ENDOSCOPIC PROCEDURE TODAY: Refer to the procedure report and other information in the discharge instructions given to you for any specific questions about what was found during the examination. If this information does not answer your questions, please call [facility] office at [phone] to clarify.  ° °YOU SHOULD EXPECT: Some feelings of bloating in the abdomen. Passage of more gas than usual. Walking can help get rid of the air that was put into your GI tract during the procedure and reduce the bloating. If you had a lower endoscopy (such as a colonoscopy or flexible sigmoidoscopy) you may notice spotting of blood in your stool or on the toilet paper. Some abdominal soreness may be present for a day or two, also. ° °DIET: Your first meal following the procedure should be a light meal and then it is ok to progress to your normal diet. A half-sandwich or bowl of soup is an example of a good first meal. Heavy or fried foods are harder to digest and may make you feel nauseous or bloated. Drink plenty of fluids but you should avoid alcoholic beverages for 24 hours. If you had a esophageal dilation, please see attached instructions for diet.   ° °ACTIVITY: Your care partner should take you home directly after the procedure. You should plan to take it easy, moving slowly for the rest of the day. You can resume normal activity the day after the procedure however YOU SHOULD NOT DRIVE, use power tools, machinery or perform tasks that involve climbing or major physical exertion for 24 hours (because of the sedation medicines used during the test).  ° °SYMPTOMS TO REPORT IMMEDIATELY: °A gastroenterologist can be reached at any hour. Please call [phone]  for any of the following symptoms:  °Following lower endoscopy (colonoscopy, flexible sigmoidoscopy) °Excessive amounts of blood in the stool  °Significant tenderness, worsening of abdominal pains  °Swelling of the abdomen that is new, acute  °Fever of 100° or  higher  °Following upper endoscopy (EGD, EUS, ERCP, esophageal dilation) °Vomiting of blood or coffee ground material  °New, significant abdominal pain  °New, significant chest pain or pain under the shoulder blades  °Painful or persistently difficult swallowing  °New shortness of breath  °Black, tarry-looking or red, bloody stools ° °FOLLOW UP:  °If any biopsies were taken you will be contacted by phone or by letter within the next 1-3 weeks. Call [phone]  if you have not heard about the biopsies in 3 weeks.  °Please also call with any specific questions about appointments or follow up tests. ° °

## 2022-06-26 NOTE — Anesthesia Preprocedure Evaluation (Addendum)
Anesthesia Evaluation  Patient identified by MRN, date of birth, ID band Patient awake    Reviewed: Allergy & Precautions, NPO status , Patient's Chart, lab work & pertinent test results, reviewed documented beta blocker date and time   Airway Mallampati: II  TM Distance: >3 FB Neck ROM: Full    Dental  (+) Dental Advisory Given, Edentulous Upper, Missing   Pulmonary sleep apnea, Continuous Positive Airway Pressure Ventilation and Oxygen sleep apnea , COPD,  COPD inhaler and oxygen dependent, former smoker   Pulmonary exam normal breath sounds clear to auscultation       Cardiovascular hypertension, Pt. on home beta blockers and Pt. on medications + angina  + CAD, + Past MI, + Cardiac Stents, + CABG and +CHF  Normal cardiovascular exam Rhythm:Regular Rate:Normal     Neuro/Psych negative neurological ROS     GI/Hepatic Neg liver ROS,GERD  Medicated,,  Endo/Other  diabetes, Type 2, Insulin Dependent, Oral Hypoglycemic Agents    Renal/GU negative Renal ROS     Musculoskeletal negative musculoskeletal ROS (+)    Abdominal   Peds  Hematology  (+) Blood dyscrasia (Brilinta)   Anesthesia Other Findings Day of surgery medications reviewed with the patient.  Reproductive/Obstetrics                             Anesthesia Physical Anesthesia Plan  ASA: 4  Anesthesia Plan: MAC   Post-op Pain Management:    Induction: Intravenous  PONV Risk Score and Plan: 1 and TIVA and Treatment may vary due to age or medical condition  Airway Management Planned: Natural Airway and Simple Face Mask  Additional Equipment:   Intra-op Plan:   Post-operative Plan:   Informed Consent: I have reviewed the patients History and Physical, chart, labs and discussed the procedure including the risks, benefits and alternatives for the proposed anesthesia with the patient or authorized representative who has indicated  his/her understanding and acceptance.     Dental advisory given  Plan Discussed with: CRNA and Anesthesiologist  Anesthesia Plan Comments:         Anesthesia Quick Evaluation

## 2022-06-26 NOTE — Anesthesia Procedure Notes (Signed)
Procedure Name: MAC Date/Time: 06/26/2022 9:10 AM  Performed by: Vanessa , CRNAPre-anesthesia Checklist: Patient identified, Emergency Drugs available, Suction available and Patient being monitored Patient Re-evaluated:Patient Re-evaluated prior to induction Oxygen Delivery Method: Simple face mask

## 2022-06-26 NOTE — H&P (Signed)
Chief Complaint: Discuss colonoscopy on chronic anticoagulation, positive Cologuard   HPI:    [name] is a 62 year old male with a past medical history as listed below including anxiety, CHF, diabetes, GERD, previous MI on oxygen, who was referred to me by Alejandro FaceLim, Sheri, DO for a complaint of positive Cologuard.    12/09/2021 patient followed with his cardiologist in regards to his ischemic dilated cardiomyopathy, CAD with CABG in 2007, NSTEMI 2017, stent placement 2019 and 2020 as well as the last 10/22.  Continued on Brilinta and he was doing well and told to follow-up in 6 months.    01/01/2022 Cologuard positive.     Today, the patient presents to clinic and tells me that he had a previous colonoscopy about 10 years ago or so with Dr. Chales Taylor which was negative/normal.  Last year he had what sounds like a Hemoccult test which was negative and this year he had a Cologuard test which turned out to be positive.  Patient is very anxious about these results and what they mean.  Tells me he does not have any GI issues.      He does discuss his claustrophobia and need to leave the exam room door open and also the fact that he typically uses oxygen at night.  He is also currently on Brilinta.    [place].    Denies fever, chills, abdominal pain, weight loss or blood in his stool.       Past Medical History:  Diagnosis Date   Anginal pain (HCC)     Anxiety     Arthritis     CHF (congestive heart failure) (HCC)     Depression     Diabetes mellitus without complication (HCC)      Type II   GERD (gastroesophageal reflux disease)     Hypertension     Myocardial infarction (HCC)      x2   On home oxygen therapy      only as needed for sleep apnea   Pneumonia     Rotator cuff injury, left, initial encounter     Sleep apnea             Past Surgical History:  Procedure Laterality Date   CORONARY ANGIOPLASTY        Stent x2   CORONARY ARTERY BYPASS GRAFT       ROTATOR  CUFF REPAIR        Right   SHOULDER ARTHROSCOPY WITH ROTATOR CUFF REPAIR AND SUBACROMIAL DECOMPRESSION Left 11/27/2016    Procedure: SHOULDER ARTHROSCOPY WITH ROTATOR CUFF REPAIR AND SUBACROMIAL DECOMPRESSION, distal clavicular resection;  Surgeon: Alejandro HanlySupple, Kevin, MD;  Location: [facility] OR;  Service: Orthopedics;  Laterality: Left;            Current Outpatient Medications  Medication Sig Dispense Refill   ALPRAZolam (XANAX) 0.5 MG tablet Take 0.125-0.25 mg by mouth 2 (two) times daily as needed for anxiety.       aspirin 81 MG EC tablet Take by mouth.       atorvastatin (LIPITOR) 80 MG tablet Take 80 mg by mouth daily.       B-D 3CC LUER-LOK SYR 22GX1" 22G X 1" 3 ML MISC Inject into the muscle every 14 (fourteen) days.       BRILINTA 90 MG TABS tablet Take 90 mg by mouth 2 (two) times daily.       carvedilol (COREG) 12.5 MG tablet  chlorhexidine (PERIDEX) 0.12 % solution 15 mLs 2 (two) times daily.       clindamycin (CLINDAGEL) 1 % gel SMARTSIG:sparingly Topical Twice Daily       colesevelam (WELCHOL) 625 MG tablet Take 1,875 mg by mouth 2 (two) times daily.       escitalopram (LEXAPRO) 20 MG tablet Take 20 mg by mouth daily.       fluconazole (DIFLUCAN) 200 MG tablet Take 200 mg by mouth daily.       fluticasone (FLONASE) 50 MCG/ACT nasal spray Place 1 spray into both nostrils daily.       furosemide (LASIX) 40 MG tablet Take 20 mg by mouth daily.       insulin glargine (LANTUS) 100 UNIT/ML injection Inject 35 Units into the skin every evening.       insulin lispro (HUMALOG) 100 UNIT/ML injection Inject 2-3 Units into the skin every evening.       ipratropium-albuterol (DUONEB) 0.5-2.5 (3) MG/3ML SOLN Inhale 3 mLs into the lungs 4 (four) times daily as needed for shortness of breath.       isosorbide mononitrate (IMDUR) 30 MG 24 hr tablet         JARDIANCE 25 MG TABS tablet Take 25 mg by mouth daily.       lisinopril (ZESTRIL) 20 MG tablet Take 20 mg by mouth daily.        methylPREDNISolone (MEDROL) 4 MG tablet Take by mouth.       metoCLOPramide (REGLAN) 10 MG tablet         metroNIDAZOLE (METROGEL) 1 % gel Apply topically daily.       NARCAN 4 MG/0.1ML LIQD nasal spray kit SMARTSIG:Both Nares       nitroGLYCERIN (NITROSTAT) 0.4 MG SL tablet Place under the tongue.       nystatin (MYCOSTATIN) 100000 UNIT/ML suspension SMARTSIG:4 Milliliter(s) By Mouth 4 Times Daily       nystatin cream (MYCOSTATIN) Apply 1 application topically daily as needed (skin flares).        Omega-3 1000 MG CAPS Take by mouth.       omeprazole (PRILOSEC) 40 MG capsule Take 1 capsule by mouth daily.       oxycodone (ROXICODONE) 30 MG immediate release tablet Take 60 mg by mouth every 4 (four) hours.       PARoxetine (PAXIL) 20 MG tablet         potassium chloride SA (KLOR-CON) 20 MEQ tablet Take 20 mEq by mouth 2 (two) times daily.       pravastatin (PRAVACHOL) 40 MG tablet Take 40 mg by mouth daily.       PROAIR HFA 108 (90 Base) MCG/ACT inhaler Inhale 2 puffs into the lungs every 6 (six) hours as needed for shortness of breath.       promethazine (PHENERGAN) 25 MG tablet Take by mouth.       psyllium (METAMUCIL) 0.52 g capsule Take 0.52 g by mouth 2 (two) times daily.       tamsulosin (FLOMAX) 0.4 MG CAPS capsule Take 0.4 mg by mouth daily.       Testosterone 20.25 MG/ACT (1.62%) GEL SMARTSIG:2 Pump Topical Daily       testosterone cypionate (DEPOTESTOSTERONE CYPIONATE) 200 MG/ML injection Inject 1 dose every 30 days       triamcinolone cream (KENALOG) 0.1 %         Umeclidinium-Vilanterol (ANORO ELLIPTA IN) Inhale into the lungs.       UNIFINE PENTIPS 31G X  8 MM MISC USE 1 NEEDLE 4 TIMES DAILY       XTAMPZA ER 9 MG C12A Take 1 capsule by mouth every 8 (eight) hours.        No current facility-administered medications for this visit.           Allergies as of 02/25/2022 - Review Complete 01/27/2022  Allergen Reaction Noted   Benicar [olmesartan]   11/24/2016   Ezetimibe Other  (See Comments) 03/24/2017   Latex Itching and Swelling 10/16/2014   Moxifloxacin   10/16/2014   Prednisone Swelling 10/16/2014   Rosuvastatin   10/16/2014      No family history on file.   Social History         Socioeconomic History   Marital status: Married      Spouse name: Not on file   Number of children: Not on file   Years of education: Not on file   Highest education level: Not on file  Occupational History   Not on file  Tobacco Use   Smoking status: Former      Types: Cigarettes   Smokeless tobacco: Current      Types: Chew  Vaping Use   Vaping Use: Never used  Substance and Sexual Activity   Alcohol use: No   Drug use: No   Sexual activity: Not on file  Other Topics Concern   Not on file  Social History Narrative   Not on file    Social Determinants of Health    Financial Resource Strain: Not on file  Food Insecurity: Not on file  Transportation Needs: Not on file  Physical Activity: Not on file  Stress: Not on file  Social Connections: Not on file  Intimate Partner Violence: Not on file      Review of Systems:    Constitutional: No weight loss, fever or chills Skin: No rash  Cardiovascular: No chest pain Respiratory: No SOB  Gastrointestinal: See HPI and otherwise negative Genitourinary: No dysuria  Neurological: No headache, dizziness or syncope Musculoskeletal: No new muscle or joint pain Hematologic: No bleeding  Psychiatric: +anxiety    Physical Exam:  Vital signs: BP (!) 140/90   Pulse 93   Ht 5\' 8"  (1.727 m)   Wt 218 lb 8 oz (99.1 kg)   BMI 33.22 kg/m     Constitutional:   Pleasant male appears to be in NAD, Well developed, Well nourished, alert and cooperative Head:  Normocephalic and atraumatic. Eyes:   PEERL, EOMI. No icterus. Conjunctiva pink. Ears:  Normal auditory acuity. Neck:  Supple Throat: Oral cavity and pharynx without inflammation, swelling or lesion.  Respiratory: Respirations even and unlabored. Lungs clear to  auscultation bilaterally.   No wheezes, crackles, or rhonchi.  Cardiovascular: Normal S1, S2. No MRG. Regular rate and rhythm. No peripheral edema, cyanosis or pallor.  Gastrointestinal:  Soft, nondistended, nontender. No rebound or guarding. Normal bowel sounds. No appreciable masses or hepatomegaly. Rectal:  Not performed.  Msk:  Symmetrical without gross deformities. Without edema, no deformity or joint abnormality.  Neurologic:  Alert and  oriented x4;  grossly normal neurologically.  Skin:   Dry and intact without significant lesions or rashes. Psychiatric:  Demonstrates good judgement and reason without abnormal affect or behaviors.   No recent labs.   Assessment: 1.  Positive Cologuard 2.  History of prior MI and CAD status post stenting last at the end of 2022: On Brilinta 3.  Oxygen use at night   Plan: 1.  Discussed with patient that his procedure will need to be scheduled at the hospital with Dr. Chales Abrahams given his oxygen use at night.  Did provide the patient with a detailed list of risks for the procedure and he agrees to proceed.  Dr. Chales Abrahams does not have any availability at this time and patient was advised that we would call in March when his schedule is open. 2.  Also advised the patient to hold his Brilinta for 5 days prior to time of procedure.  We will communicate with his prescribing physician to ensure this is acceptable for him. 3.  Reviewed his positive Cologuard results and exactly what this means.  Tried to calm his nerves a bit. 4.  Patient to follow in clinic per recommendations from Dr. Chales Abrahams after time of procedure.   Hyacinth Meeker, PA-C    Attending physician's note   I have taken history, reviewed the chart and examined the patient. I performed a substantive portion of this encounter, including complete performance of at least one of the key components, in conjunction with the APP. I agree with the Advanced Practitioner's note, impression and recommendations.     For colonToday.   Edman Circle, MD [facility] [phone]

## 2022-06-26 NOTE — Op Note (Signed)
[facility] Patient Name: Alejandro Taylor Procedure Date: 06/26/2022 MRN: [id number] Attending MD: Lynann Bologna , MD, [id number] Date of Birth: 1960/08/09 CSN: [id number] Age: 62 Admit Type: Outpatient Procedure:                Colonoscopy Indications:              Positive Cologuard test Providers:                Lynann Bologna, MD, Carlena Hurl RN, RN,                            Rozetta Nunnery, Technician Referring MD:              Medicines:                Monitored Anesthesia Care Complications:            No immediate complications. Estimated Blood Loss:     Estimated blood loss: none. Procedure:                Pre-Anesthesia Assessment:                           - Prior to the procedure, a History and Physical                            was performed, and patient medications and                            allergies were reviewed. The patient's tolerance of                            previous anesthesia was also reviewed. The risks                            and benefits of the procedure and the sedation                            options and risks were discussed with the patient.                            All questions were answered, and informed consent                            was obtained. Prior Anticoagulants: Brilinta 5 days                            prior. ASA Grade Assessment: III - A patient with                            severe systemic disease. After reviewing the risks                            and benefits, the patient was deemed in  satisfactory condition to undergo the procedure.                           After obtaining informed consent, the colonoscope                            was passed under direct vision. Throughout the                            procedure, the patient's blood pressure, pulse, and                            oxygen saturations were monitored continuously. The                             [id number]-[id number] ([id number] Olympus colonoscope was                            introduced through the anus and advanced to the 2                            cm into the ileum. The colonoscopy was performed                            without difficulty. The patient tolerated the                            procedure well. The quality of the bowel                            preparation was good except in the cecum where                            there was retained solid vegetable material.                            Aggressive suctioning and aspiration was performed.                            Overall the examination was adequate. The terminal                            ileum, ileocecal valve, appendiceal orifice, and                            rectum were photographed. Scope In: 9:13:17 AM Scope Out: 9:23:41 AM Scope Withdrawal Time: 0 hours 7 minutes 55 seconds  Total Procedure Duration: 0 hours 10 minutes 24 seconds  Findings:      A few small-mouthed diverticula were found in the sigmoid colon and       ascending colon.      Non-bleeding internal hemorrhoids were found during retroflexion. The       hemorrhoids were moderate and Grade I (internal hemorrhoids that do not       prolapse).  The terminal ileum appeared normal.      The exam was otherwise without abnormality on direct and retroflexion       views. Impression:               - Diverticulosis in the sigmoid colon and in the                            ascending colon.                           - Non-bleeding internal hemorrhoids.                           - The examined portion of the ileum was normal.                           - The examination was otherwise normal on direct                            and retroflexion views.                           - No specimens collected. Moderate Sedation:      Not Applicable - Patient had care per Anesthesia. Recommendation:           - Patient has a contact number available for                             emergencies. The signs and symptoms of potential                            delayed complications were discussed with the                            patient. Return to normal activities tomorrow.                            Written discharge instructions were provided to the                            patient.                           - Resume previous diet.                           - Continue present medications.                           - Resume Brilinta (ticagrelor) at prior dose                            tomorrow.                           - Repeat colonoscopy in 10 years for screening  purposes. Earlier, if with any new problems or                            change in family history                           - The findings and recommendations were discussed                            with the patient's family. Procedure Code(s):        --- Professional ---                           [phone], Colonoscopy, flexible; diagnostic, including                            collection of specimen(s) by brushing or washing,                            when performed (separate procedure) Diagnosis Code(s):        --- Professional ---                           K64.0, First degree hemorrhoids                           R19.5, Other fecal abnormalities                           K57.30, Diverticulosis of large intestine without                            perforation or abscess without bleeding CPT copyright 2022 American Medical Association. All rights reserved. The codes documented in this report are preliminary and upon coder review may  be revised to meet current compliance requirements. Lynann Bologna, MD 06/26/2022 9:30:00 AM This report has been signed electronically. Number of Addenda: 0

## 2022-06-26 NOTE — Transfer of Care (Signed)
Immediate Anesthesia Transfer of Care Note  Patient: Alejandro Taylor  Procedure(s) Performed: COLONOSCOPY WITH PROPOFOL  Patient Location: Endoscopy Unit  Anesthesia Type:MAC  Level of Consciousness: drowsy  Airway & Oxygen Therapy: Patient Spontanous Breathing and Patient connected to face mask  Post-op Assessment: Report given to RN and Post -op Vital signs reviewed and stable  Post vital signs: Reviewed and stable  Last Vitals:  Vitals Value Taken Time  BP 95/53 06/26/22 0926  Temp 36.4 C 06/26/22 0926  Pulse 82 06/26/22 0930  Resp 14 06/26/22 0930  SpO2 100 % 06/26/22 0930  Vitals shown include unvalidated device data.  Last Pain:  Vitals:   06/26/22 0926  TempSrc: Tympanic  PainSc: Asleep         Complications: No notable events documented.

## 2022-06-27 NOTE — Anesthesia Postprocedure Evaluation (Signed)
Anesthesia Post Note  Patient: Alejandro Taylor  Procedure(s) Performed: COLONOSCOPY WITH PROPOFOL     Patient location during evaluation: Endoscopy Anesthesia Type: MAC Level of consciousness: oriented, awake and alert and awake Pain management: pain level controlled Vital Signs Assessment: post-procedure vital signs reviewed and stable Respiratory status: spontaneous breathing, nonlabored ventilation, respiratory function stable and patient connected to nasal cannula oxygen Cardiovascular status: blood pressure returned to baseline and stable Postop Assessment: no headache, no backache and no apparent nausea or vomiting Anesthetic complications: no   No notable events documented.  Last Vitals:  Vitals:   06/26/22 0936 06/26/22 0946  BP: (!) 99/40 (!) 107/53  Pulse: 80 83  Resp: 13 13  Temp:    SpO2: 100% 97%    Last Pain:  Vitals:   06/26/22 0946  TempSrc:   PainSc: 0-No pain                 Collene Schlichter

## 2022-06-29 ENCOUNTER — Encounter (HOSPITAL_COMMUNITY): Payer: Self-pay | Admitting: Gastroenterology

## 2022-07-31 ENCOUNTER — Telehealth: Payer: Self-pay | Admitting: Podiatry

## 2022-07-31 NOTE — Telephone Encounter (Signed)
Lmom for patient to call back to set up appointment to pick up diabetic shoes

## 2022-08-26 ENCOUNTER — Ambulatory Visit (INDEPENDENT_AMBULATORY_CARE_PROVIDER_SITE_OTHER): Payer: 59 | Admitting: Podiatry

## 2022-08-26 DIAGNOSIS — L84 Corns and callosities: Secondary | ICD-10-CM

## 2022-08-26 DIAGNOSIS — M2042 Other hammer toe(s) (acquired), left foot: Secondary | ICD-10-CM | POA: Diagnosis not present

## 2022-08-26 DIAGNOSIS — E1142 Type 2 diabetes mellitus with diabetic polyneuropathy: Secondary | ICD-10-CM

## 2022-08-26 DIAGNOSIS — M2041 Other hammer toe(s) (acquired), right foot: Secondary | ICD-10-CM

## 2022-08-26 NOTE — Progress Notes (Signed)
Patient presents today to pick up diabetic shoes and insoles.  Patient was dispensed 1 pair of diabetic shoes and 3 pairs of foam casted diabetic insoles. Fit was satisfactory. Instructions for break-in and wear was reviewed and a copy was given to the patient.   Re-appointment for regularly scheduled diabetic foot care visits or if they should experience any trouble with the shoes or insoles.  

## 2022-09-04 ENCOUNTER — Ambulatory Visit: Payer: 59 | Admitting: Podiatry

## 2022-09-17 ENCOUNTER — Ambulatory Visit: Payer: 59 | Admitting: Podiatry
